# Patient Record
Sex: Male | Born: 1980 | Race: White | Hispanic: No | Smoking: Current every day smoker
Health system: Southern US, Community
[De-identification: ages and names within clinical notes are randomized; demographics above are authoritative.]

## PROBLEM LIST (undated history)

## (undated) DIAGNOSIS — F410 Panic disorder [episodic paroxysmal anxiety] without agoraphobia: Secondary | ICD-10-CM

## (undated) DIAGNOSIS — F329 Major depressive disorder, single episode, unspecified: Secondary | ICD-10-CM

## (undated) DIAGNOSIS — F32A Depression, unspecified: Secondary | ICD-10-CM

## (undated) DIAGNOSIS — Z8489 Family history of other specified conditions: Secondary | ICD-10-CM

## (undated) DIAGNOSIS — F319 Bipolar disorder, unspecified: Secondary | ICD-10-CM

## (undated) DIAGNOSIS — Z8739 Personal history of other diseases of the musculoskeletal system and connective tissue: Secondary | ICD-10-CM

## (undated) HISTORY — PX: LAPAROSCOPIC CHOLECYSTECTOMY: SUR755

---
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
## 2014-02-08 ENCOUNTER — Emergency Department (HOSPITAL_COMMUNITY)
Admission: EM | Admit: 2014-02-08 | Discharge: 2014-02-09 | Disposition: A | Discharge status: Home / Self Care | Payer: Self-pay | Attending: Emergency Medicine | Admitting: Emergency Medicine

## 2014-02-08 ENCOUNTER — Encounter (HOSPITAL_COMMUNITY): Payer: Self-pay | Admitting: *Deleted

## 2014-02-08 DIAGNOSIS — Z720 Tobacco use: Secondary | ICD-10-CM | POA: Insufficient documentation

## 2014-02-08 DIAGNOSIS — F419 Anxiety disorder, unspecified: Secondary | ICD-10-CM | POA: Insufficient documentation

## 2014-02-08 DIAGNOSIS — F1210 Cannabis abuse, uncomplicated: Secondary | ICD-10-CM | POA: Insufficient documentation

## 2014-02-08 DIAGNOSIS — Z79899 Other long term (current) drug therapy: Secondary | ICD-10-CM | POA: Insufficient documentation

## 2014-02-08 DIAGNOSIS — Z91148 Patient's other noncompliance with medication regimen for other reason: Secondary | ICD-10-CM

## 2014-02-08 DIAGNOSIS — F22 Delusional disorders: Secondary | ICD-10-CM

## 2014-02-08 DIAGNOSIS — Z9114 Patient's other noncompliance with medication regimen: Secondary | ICD-10-CM | POA: Insufficient documentation

## 2014-02-08 DIAGNOSIS — F1310 Sedative, hypnotic or anxiolytic abuse, uncomplicated: Secondary | ICD-10-CM | POA: Insufficient documentation

## 2014-02-08 DIAGNOSIS — F1910 Other psychoactive substance abuse, uncomplicated: Secondary | ICD-10-CM

## 2014-02-08 DIAGNOSIS — F1410 Cocaine abuse, uncomplicated: Secondary | ICD-10-CM | POA: Insufficient documentation

## 2014-02-08 HISTORY — DX: Panic disorder (episodic paroxysmal anxiety): F41.0

## 2014-02-08 HISTORY — DX: Bipolar disorder, unspecified: F31.9

## 2014-02-08 LAB — CBC WITH DIFFERENTIAL/PLATELET
Basophils Absolute: 0 10*3/uL (ref 0.0–0.1)
Basophils Relative: 0 % (ref 0–1)
Eosinophils Absolute: 0.2 10*3/uL (ref 0.0–0.7)
Eosinophils Relative: 2 % (ref 0–5)
HCT: 45.5 % (ref 39.0–52.0)
Hemoglobin: 15.8 g/dL (ref 13.0–17.0)
LYMPHS ABS: 1.3 10*3/uL (ref 0.7–4.0)
LYMPHS PCT: 18 % (ref 12–46)
MCH: 31.8 pg (ref 26.0–34.0)
MCHC: 34.7 g/dL (ref 30.0–36.0)
MCV: 91.5 fL (ref 78.0–100.0)
MONO ABS: 0.5 10*3/uL (ref 0.1–1.0)
Monocytes Relative: 7 % (ref 3–12)
Neutro Abs: 5.5 10*3/uL (ref 1.7–7.7)
Neutrophils Relative %: 73 % (ref 43–77)
PLATELETS: 274 10*3/uL (ref 150–400)
RBC: 4.97 MIL/uL (ref 4.22–5.81)
RDW: 13.1 % (ref 11.5–15.5)
WBC: 7.5 10*3/uL (ref 4.0–10.5)

## 2014-02-08 LAB — BASIC METABOLIC PANEL
Anion gap: 16 — ABNORMAL HIGH (ref 5–15)
BUN: 7 mg/dL (ref 6–23)
CALCIUM: 9.5 mg/dL (ref 8.4–10.5)
CO2: 26 meq/L (ref 19–32)
Chloride: 101 mEq/L (ref 96–112)
Creatinine, Ser: 0.78 mg/dL (ref 0.50–1.35)
GFR calc Af Amer: >90 mL/min (ref >90)
Glucose, Bld: 116 mg/dL — ABNORMAL HIGH (ref 70–99)
Potassium: 4 mEq/L (ref 3.7–5.3)
Sodium: 143 mEq/L (ref 137–147)

## 2014-02-08 LAB — ETHANOL: Alcohol, Ethyl (B): 26 mg/dL — ABNORMAL HIGH (ref 0–11)

## 2014-02-08 MED ORDER — LORAZEPAM 1 MG PO TABS
1.0000 mg | ORAL_TABLET | Freq: Three times a day (TID) | ORAL | Status: DC | PRN
  Administered 2014-02-09: 1 mg via ORAL
  Filled 2014-02-08: qty 1

## 2014-02-08 MED ORDER — IBUPROFEN 400 MG PO TABS
400.0000 mg | ORAL_TABLET | Freq: Three times a day (TID) | ORAL | Status: DC | PRN
  Administered 2014-02-09: 400 mg via ORAL
  Filled 2014-02-08: qty 1

## 2014-02-08 MED ORDER — ALUM & MAG HYDROXIDE-SIMETH 200-200-20 MG/5ML PO SUSP: 30.0000 mL | ORAL | Status: DC | PRN

## 2014-02-08 MED ORDER — NICOTINE 21 MG/24HR TD PT24: 21.0000 mg | MEDICATED_PATCH | Freq: Every day | TRANSDERMAL | Status: DC | PRN

## 2014-02-08 MED ORDER — ACETAMINOPHEN 325 MG PO TABS: 650.0000 mg | ORAL_TABLET | ORAL | Status: DC | PRN
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
## 2014-02-08 NOTE — BH Assessment (Signed)
Assessment complete. Jacquelyne BalintShalita Forrest, [facility], confirms adult unit is at capacity. Gave clinical report to Verne SpurrNeil Mashburn, PA-C who said Pt meets criteria for inpatient dual diagnosis treatment. TTS will contact other facilities. Notified Dr. Samuel JesterKathleen McManus and Neysa Bonitohristy, ED Charge RN at [facility], of recommendation.  Harlin RainFord Ellis Ria CommentWarrick Jr, LPC, [facility] Triage Specialist [phone]
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
## 2014-02-08 NOTE — BH Assessment (Signed)
Jacquelyne BalintShalita Forrest, [facility], confirms adult unit is currently at capacity. Contacted the following facilities for placement:  BED AVAILABLE, FAXED CLINICAL INFORMATION: [facility], per [facility], per [facility], per [facility], per [facility], per [facility], per Carney BernJean  AT CAPACITY: [facility], per [facility], per [facility], per [facility], per [facility], per [facility], per [facility], per [facility], per [facility], per [facility]hel[facility] DodgeSharita  NO RESPONSE: [facility]  PT DECLINED: [employer] (substance abuse) [facility] (substance abuse)   Harlin RainFord Ellis Ria CommentWarrick Jr, LPC, [facility] Triage Specialist [phone]

## 2014-02-08 NOTE — ED Notes (Addendum)
Pt was brought in by [employer]. [employer] was called out to the patients house for an argument between him and his girlfriend. Pt requested treatment for his bipolar and schizophrenia. Pt states he his bipolar and schizophenic and states he need his medication that he feels like he could "flip out" at any point. Pt denies any SI/HI. Pt is really concerned about having his xanax refilled. Pt states he is scared that his wife is poisoning his food.
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
## 2014-02-09 LAB — RAPID URINE DRUG SCREEN, HOSP PERFORMED
AMPHETAMINES: NOT DETECTED
Barbiturates: NOT DETECTED
Benzodiazepines: POSITIVE — AB
COCAINE: NOT DETECTED
Opiates: POSITIVE — AB
TETRAHYDROCANNABINOL: POSITIVE — AB

## 2014-02-09 MED ORDER — LORAZEPAM 1 MG PO TABS
2.0000 mg | ORAL_TABLET | Freq: Once | ORAL | Status: AC
  Administered 2014-02-09: 2 mg via ORAL
  Filled 2014-02-09: qty 2

## 2014-02-09 MED ORDER — LORAZEPAM 1 MG PO TABS
2.0000 mg | ORAL_TABLET | ORAL | Status: DC | PRN
  Administered 2014-02-09: 2 mg via ORAL
  Filled 2014-02-09: qty 2

## 2014-02-09 NOTE — ED Notes (Signed)
Pt came out of room assertive and demanding his clothes. Stated he was leaving and could "take my own medicine at home" explained that his treatment here was medical clearance and the detox treatment begins at the treatment facility once he has been transferred. Pt states he takes 4-5 xanax a day and drinks and he wants that here. So he is leaving if he can not have it. EDP notified, and went in room to speak to patient. Security at room for availability. Pt remains in room at this time.

## 2014-02-09 NOTE — ED Notes (Signed)
Pt woke up asking for "nerve pills" and something for pain. Was given prn ativan and motrin. Pt states he takes 4 or 5 2 mg xanax a day and the ativan "aint gonna do shit" but he took the medication anyway. He asked for water and a snack and it was given.
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
## 2014-02-09 NOTE — ED Provider Notes (Signed)
Patient became very upset when he was given a dose of lorazepam 1 mg. He states that he has been taking 8-10 mg alprazolam several times a day and that the 1 mg lorazepam was completely inadequate for him. He threatened to leave AGAINST MEDICAL ADVICE. I sent down to 12 with them and agreed to increase his lorazepam dose and frequency to try to keep him from going into withdrawal. Patient appeared satisfied with that and agreed to stay.  Dione Boozeavid Joniel Graumann, MD 02/09/14 [phone]

## 2014-02-09 NOTE — ED Notes (Signed)
Spoke from nurse [facility] who requested information about pt. For [facility].

## 2014-02-09 NOTE — ED Notes (Signed)
Alvia Phillip Mcdowell Marr Declined patient, [facility] notified.

## 2014-02-09 NOTE — ED Notes (Signed)
Pt came to nurses desk requesting to leave. Nurse advised pt she was going to tell doctor and she would be in there to get him to sign his papers to leave. Before nurse could inform doctor pt walked out of the ED.   EDP and charge nurse made aware.

## 2014-02-09 NOTE — BH Assessment (Signed)
Phillip GroveBrynn Mcdowell does not have IPRS funds with [facility] and therefore cannot accept Pt.  Phillip RainFord Mcdowell Phillip CommentWarrick Mcdowell, LPC, [facility] Triage Specialist [phone]

## 2014-02-09 NOTE — ED Notes (Signed)
Pt verbalized that he was going to leave in a few hours if he didn't have a placement. Nurse re-assured pt that we were seeking placement for him and that it might take some time.

## 2014-02-09 NOTE — ED Notes (Signed)
Pt agitated and c/o need for xanax not ativan, but stated he would take it. Said he didn't have to be here and would stay a while but was tired of it. And wanted his xanax

## 2014-05-11 ENCOUNTER — Encounter (HOSPITAL_COMMUNITY): Payer: Self-pay

## 2014-05-11 ENCOUNTER — Emergency Department (HOSPITAL_COMMUNITY)
Admission: EM | Admit: 2014-05-11 | Discharge: 2014-05-11 | Disposition: A | Discharge status: Home / Self Care | Payer: Self-pay | Attending: Emergency Medicine | Admitting: Emergency Medicine

## 2014-05-11 DIAGNOSIS — F1910 Other psychoactive substance abuse, uncomplicated: Secondary | ICD-10-CM

## 2014-05-11 DIAGNOSIS — F1410 Cocaine abuse, uncomplicated: Secondary | ICD-10-CM | POA: Insufficient documentation

## 2014-05-11 DIAGNOSIS — F410 Panic disorder [episodic paroxysmal anxiety] without agoraphobia: Secondary | ICD-10-CM | POA: Insufficient documentation

## 2014-05-11 DIAGNOSIS — F1310 Sedative, hypnotic or anxiolytic abuse, uncomplicated: Secondary | ICD-10-CM | POA: Insufficient documentation

## 2014-05-11 DIAGNOSIS — F1210 Cannabis abuse, uncomplicated: Secondary | ICD-10-CM | POA: Insufficient documentation

## 2014-05-11 DIAGNOSIS — Z720 Tobacco use: Secondary | ICD-10-CM | POA: Insufficient documentation

## 2014-05-11 DIAGNOSIS — Z880 Allergy status to penicillin: Secondary | ICD-10-CM | POA: Insufficient documentation

## 2014-05-11 DIAGNOSIS — Z79899 Other long term (current) drug therapy: Secondary | ICD-10-CM | POA: Insufficient documentation

## 2014-05-11 LAB — CBC WITH DIFFERENTIAL/PLATELET
Basophils Absolute: 0 10*3/uL (ref 0.0–0.1)
Basophils Relative: 0 % (ref 0–1)
EOS ABS: 0.1 10*3/uL (ref 0.0–0.7)
Eosinophils Relative: 1 % (ref 0–5)
HEMATOCRIT: 50.6 % (ref 39.0–52.0)
HEMOGLOBIN: 18.2 g/dL — AB (ref 13.0–17.0)
LYMPHS ABS: 1.5 10*3/uL (ref 0.7–4.0)
Lymphocytes Relative: 16 % (ref 12–46)
MCH: 33.7 pg (ref 26.0–34.0)
MCHC: 36 g/dL (ref 30.0–36.0)
MCV: 93.7 fL (ref 78.0–100.0)
MONOS PCT: 8 % (ref 3–12)
Monocytes Absolute: 0.7 10*3/uL (ref 0.1–1.0)
NEUTROS ABS: 7.3 10*3/uL (ref 1.7–7.7)
Neutrophils Relative %: 75 % (ref 43–77)
Platelets: 232 10*3/uL (ref 150–400)
RBC: 5.4 MIL/uL (ref 4.22–5.81)
RDW: 13.4 % (ref 11.5–15.5)
WBC: 9.6 10*3/uL (ref 4.0–10.5)

## 2014-05-11 LAB — COMPREHENSIVE METABOLIC PANEL
ALK PHOS: 96 U/L (ref 39–117)
ALT: 77 U/L — AB (ref 0–53)
AST: 67 U/L — ABNORMAL HIGH (ref 0–37)
Albumin: 4.5 g/dL (ref 3.5–5.2)
Anion gap: 10 (ref 5–15)
BUN: 6 mg/dL (ref 6–23)
CO2: 28 mmol/L (ref 19–32)
Calcium: 9.4 mg/dL (ref 8.4–10.5)
Chloride: 103 mmol/L (ref 96–112)
Creatinine, Ser: 0.91 mg/dL (ref 0.50–1.35)
GFR calc non Af Amer: >90 mL/min (ref >90)
GLUCOSE: 132 mg/dL — AB (ref 70–99)
POTASSIUM: 4 mmol/L (ref 3.5–5.1)
Sodium: 141 mmol/L (ref 135–145)
TOTAL PROTEIN: 8.1 g/dL (ref 6.0–8.3)
Total Bilirubin: 0.8 mg/dL (ref 0.3–1.2)

## 2014-05-11 LAB — RAPID URINE DRUG SCREEN, HOSP PERFORMED
AMPHETAMINES: NOT DETECTED
Barbiturates: NOT DETECTED
Benzodiazepines: POSITIVE — AB
Cocaine: POSITIVE — AB
Opiates: NOT DETECTED
Tetrahydrocannabinol: POSITIVE — AB

## 2014-05-11 LAB — ETHANOL: ALCOHOL ETHYL (B): 118 mg/dL — AB (ref 0–9)

## 2014-05-11 MED ORDER — NICOTINE 21 MG/24HR TD PT24
21.0000 mg | MEDICATED_PATCH | Freq: Once | TRANSDERMAL | Status: DC
  Administered 2014-05-11: 21 mg via TRANSDERMAL
  Filled 2014-05-11: qty 1

## 2014-05-11 MED ORDER — LORAZEPAM 1 MG PO TABS
1.0000 mg | ORAL_TABLET | Freq: Once | ORAL | Status: AC
  Administered 2014-05-11: 1 mg via ORAL
  Filled 2014-05-11: qty 1

## 2014-05-11 MED ORDER — LORAZEPAM 1 MG PO TABS
0.0000 mg | ORAL_TABLET | Freq: Four times a day (QID) | ORAL | Status: DC
  Administered 2014-05-11 (×2): 1 mg via ORAL
  Filled 2014-05-11 (×2): qty 1

## 2014-05-11 MED ORDER — LORAZEPAM 1 MG PO TABS
0.0000 mg | ORAL_TABLET | Freq: Two times a day (BID) | ORAL | Status: DC
Start: 2014-05-13 — End: 2014-05-11

## 2014-05-11 NOTE — ED Notes (Addendum)
Pt states needs help getting off all drugs. "Says drinks daily, smokes crack, shooting heroin, eating pain pills like tic tac's" . States used cocaine before coming to the ER.
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
## 2014-05-11 NOTE — BH Assessment (Signed)
Call[facility] and [facility] with [facility] at [facility]. Gave [facility] the [facility] authorization number. [facility] said they would send a car to get the Pt at 8pm on 05/11/14. Gave Tyawn the number for the nurse's station at [facility].   Rollen SoxMary Sweden Lesure, MA, LPCA, LCASA Therapeutic Triage Specialist [facility]

## 2014-05-11 NOTE — ED Notes (Signed)
Phillip KaKathy Allen, RN [facility] called to speak with pt.

## 2014-05-11 NOTE — ED Notes (Addendum)
Sp[facility] with [facility] [phone]).  [facility] has 10 pts ahead of him.  Likely pt will not be evaluated this weekend [facility].  Pt may be discharged and for possible direct admit at [facility].  Dr Clarene DukeMcManus informed.  In[facility] by [facility], that pt had previously refused to go to [facility].

## 2014-05-11 NOTE — ED Notes (Signed)
Phill[facility]cdowell at [facility] [place] called to speak with the Pt, after which she told us that we needed to get  [facility] authorization for the patient to go there.  I spoke with Irving BurtonEmily at [facility] and she will take care of it.

## 2014-05-11 NOTE — ED Notes (Signed)
Pt signed voluntary admission papers.  [facility]

## 2014-05-11 NOTE — ED Notes (Signed)
Dr Hyacinth MeekerMiller informed of pt admission at 2100 [facility] per Maryjean KaKathy Allen, RN.

## 2014-05-11 NOTE — ED Notes (Signed)
Pt has the smell of ETOH on his breath.

## 2014-05-11 NOTE — ED Provider Notes (Signed)
CSN: [id number]     Arrival date & time 05/11/14  0358 History   First MD Initiated Contact with Patient 05/11/14 [phone]     Chief Complaint  Patient presents with  . Medical Clearance     (Consider location/radiation/quality/duration/timing/severity/associated sxs/prior Treatment) HPI Comments: Patient is a 34 year old male with history of polysubstance abuse. He presents here requesting detox from drugs and alcohol and treatment for substance abuse. He tells me that he drinks every day, does $20 worth of hair one per day, does $50 worth of crack per day, and eats pain pills like "take tacks". His last drug use was cocaine which he used just before coming here.  The history is provided by the patient.    Past Medical History  Diagnosis Date  . Bipolar 1 disorder   . Schizophrenia   . Panic attack    Past Surgical History  Procedure Laterality Date  . Cholecystectomy     No family history on file. History  Substance Use Topics  . Smoking status: Current Every Day Smoker -- 0.50 packs/day    Types: Cigarettes  . Smokeless tobacco: Not on file  . Alcohol Use: Yes    Review of Systems  All other systems reviewed and are negative.     Allergies  Penicillins and Sulfa antibiotics  Home Medications   Prior to Admission medications   Medication Sig Start Date End Date Taking? Authorizing Provider  ALPRAZolam Prudy Feeler(XANAX) 0.5 MG tablet Take 0.5-1 mg by mouth 4 (four) times daily as needed for anxiety.   Yes Historical Provider, MD   BP 138/84 mmHg  Pulse 92  Temp(Src) 97.9 F (36.6 C) (Oral)  Resp 16  Ht 6' (1.829 m)  Wt 185 lb (83.915 kg)  BMI 25.08 kg/m2  SpO2 100% Physical Exam  Constitutional: He is oriented to person, place, and time. He appears well-developed and well-nourished. No distress.  HENT:  Head: Normocephalic and atraumatic.  Neck: Normal range of motion. Neck supple.  Cardiovascular: Normal rate, regular rhythm and normal heart sounds.   No murmur  heard. Pulmonary/Chest: Effort normal and breath sounds normal. No respiratory distress. He has no wheezes.  Abdominal: Soft. Bowel sounds are normal. He exhibits no distension. There is no tenderness.  Musculoskeletal: Normal range of motion. He exhibits no edema.  Neurological: He is alert and oriented to person, place, and time.  Skin: Skin is warm and dry. He is not diaphoretic.  Nursing note and vitals reviewed.   ED Course  Procedures (including critical care time) Labs Review Labs Reviewed  URINE RAPID DRUG SCREEN (HOSP PERFORMED)  ETHANOL  CBC WITH DIFFERENTIAL/PLATELET  COMPREHENSIVE METABOLIC PANEL    Imaging Review No results found.   EKG Interpretation None      MDM   Final diagnoses:  None    Patient here requesting help with from multiple drugs. [facility] has been consulted and will make arrangements for this.    Geoffery Lyonsouglas Leif Loflin, MD 05/11/14 [phone]

## 2014-05-11 NOTE — Progress Notes (Signed)
CSW spoke with RN and MD regarding patient referral to [facility].  CSW gave updated bed availability to RN and MD.  CSW also spoke with patient to see if patient would be willing to reconsider [facility] due to [facility] not [facility] and [facility] reporting there are 10 referrals ahead of patient.  CSW will make referral to [facility].  Pending: [facility]  Phillip AmasEdith Maram Bently, LCSW Disposition Social Worker [phone]

## 2014-05-11 NOTE — ED Notes (Signed)
Pt states he wants to get help to get off of heroin, crack, narcotic pain pills, alcohol.   Pt denies SI/HI, states he has been in treatment in the past.

## 2014-05-11 NOTE — Progress Notes (Signed)
CSW followed up with [facility].  CSW spoke with Zenda Alpersian who reported that they have not reviewed the referral as of this time but that she would and would call back with disposition but that there are several people ahead of patient.  CSW will follow up.  Adelene AmasEdith Lawarence Meek, LCSW Disposition Social Worker [phone]

## 2014-09-07 ENCOUNTER — Encounter (HOSPITAL_COMMUNITY): Payer: Self-pay | Admitting: Emergency Medicine

## 2014-09-07 ENCOUNTER — Emergency Department (HOSPITAL_COMMUNITY)
Admission: EM | Admit: 2014-09-07 | Discharge: 2014-09-07 | Disposition: A | Discharge status: Home / Self Care | Payer: No Typology Code available for payment source | Attending: Emergency Medicine | Admitting: Emergency Medicine

## 2014-09-07 DIAGNOSIS — Z880 Allergy status to penicillin: Secondary | ICD-10-CM | POA: Diagnosis not present

## 2014-09-07 DIAGNOSIS — Y9389 Activity, other specified: Secondary | ICD-10-CM | POA: Diagnosis not present

## 2014-09-07 DIAGNOSIS — F410 Panic disorder [episodic paroxysmal anxiety] without agoraphobia: Secondary | ICD-10-CM | POA: Diagnosis not present

## 2014-09-07 DIAGNOSIS — S93401A Sprain of unspecified ligament of right ankle, initial encounter: Secondary | ICD-10-CM | POA: Diagnosis not present

## 2014-09-07 DIAGNOSIS — Z720 Tobacco use: Secondary | ICD-10-CM | POA: Diagnosis not present

## 2014-09-07 DIAGNOSIS — S93409A Sprain of unspecified ligament of unspecified ankle, initial encounter: Secondary | ICD-10-CM

## 2014-09-07 DIAGNOSIS — Y998 Other external cause status: Secondary | ICD-10-CM | POA: Diagnosis not present

## 2014-09-07 DIAGNOSIS — S99911A Unspecified injury of right ankle, initial encounter: Secondary | ICD-10-CM | POA: Diagnosis present

## 2014-09-07 DIAGNOSIS — Y92410 Unspecified street and highway as the place of occurrence of the external cause: Secondary | ICD-10-CM | POA: Insufficient documentation

## 2014-09-07 DIAGNOSIS — S93402A Sprain of unspecified ligament of left ankle, initial encounter: Secondary | ICD-10-CM | POA: Insufficient documentation

## 2014-09-07 MED ORDER — DEXAMETHASONE 4 MG PO TABS: 4.0000 mg | ORAL_TABLET | Freq: Two times a day (BID) | ORAL | Status: DC

## 2014-09-07 MED ORDER — KETOROLAC TROMETHAMINE 10 MG PO TABS
10.0000 mg | ORAL_TABLET | Freq: Once | ORAL | Status: AC
  Administered 2014-09-07: 10 mg via ORAL
  Filled 2014-09-07: qty 1

## 2014-09-07 MED ORDER — PREDNISONE 50 MG PO TABS
60.0000 mg | ORAL_TABLET | Freq: Once | ORAL | Status: AC
  Administered 2014-09-07: 60 mg via ORAL
  Filled 2014-09-07 (×2): qty 1

## 2014-09-07 MED ORDER — DICLOFENAC SODIUM 75 MG PO TBEC: 75.0000 mg | DELAYED_RELEASE_TABLET | Freq: Two times a day (BID) | ORAL | Status: DC
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
## 2014-09-07 NOTE — Discharge Instructions (Signed)
Please use crutches until you can safely apply weight to your lower extremities. Please use the ankle stirrup splints for the next 10 days. Use Decadron and diclofenac 2 times daily with food daily until all taken. Please see Dr. Romeo Apple, or the orthopedic specialist of your choice if not improving. Ankle Sprain An ankle sprain is an injury to the strong, fibrous tissues (ligaments) that hold the bones of your ankle joint together.  CAUSES An ankle sprain is usually caused by a fall or by twisting your ankle. Ankle sprains most commonly occur when you step on the outer edge of your foot, and your ankle turns inward. People who participate in sports are more prone to these types of injuries.  SYMPTOMS   Pain in your ankle. The pain may be present at rest or only when you are trying to stand or walk.  Swelling.  Bruising. Bruising may develop immediately or within 1 to 2 days after your injury.  Difficulty standing or walking, particularly when turning corners or changing directions. DIAGNOSIS  Your caregiver will ask you details about your injury and perform a physical exam of your ankle to determine if you have an ankle sprain. During the physical exam, your caregiver will press on and apply pressure to specific areas of your foot and ankle. Your caregiver will try to move your ankle in certain ways. An X-ray exam may be done to be sure a bone was not broken or a ligament did not separate from one of the bones in your ankle (avulsion fracture).  TREATMENT  Certain types of braces can help stabilize your ankle. Your caregiver can make a recommendation for this. Your caregiver may recommend the use of medicine for pain. If your sprain is severe, your caregiver may refer you to a surgeon who helps to restore function to parts of your skeletal system (orthopedist) or a physical therapist. HOME CARE INSTRUCTIONS   Apply ice to your injury for 1-2 days or as directed by your caregiver. Applying ice  helps to reduce inflammation and pain.  Put ice in a plastic bag.  Place a towel between your skin and the bag.  Leave the ice on for 15-20 minutes at a time, every 2 hours while you are awake.  Only take over-the-counter or prescription medicines for pain, discomfort, or fever as directed by your caregiver.  Elevate your injured ankle above the level of your heart as much as possible for 2-3 days.  If your caregiver recommends crutches, use them as instructed. Gradually put weight on the affected ankle. Continue to use crutches or a cane until you can walk without feeling pain in your ankle.  If you have a plaster splint, wear the splint as directed by your caregiver. Do not rest it on anything harder than a pillow for the first 24 hours. Do not put weight on it. Do not get it wet. You may take it off to take a shower or bath.  You may have been given an elastic bandage to wear around your ankle to provide support. If the elastic bandage is too tight (you have numbness or tingling in your foot or your foot becomes cold and blue), adjust the bandage to make it comfortable.  If you have an air splint, you may blow more air into it or let air out to make it more comfortable. You may take your splint off at night and before taking a shower or bath. Wiggle your toes in the splint several times  per day to decrease swelling. SEEK MEDICAL CARE IF:   You have rapidly increasing bruising or swelling.  Your toes feel extremely cold or you lose feeling in your foot.  Your pain is not relieved with medicine. SEEK IMMEDIATE MEDICAL CARE IF:  Your toes are numb or blue.  You have severe pain that is increasing. MAKE SURE YOU:   Understand these instructions.  Will watch your condition.  Will get help right away if you are not doing well or get worse. Document Released: 02/08/2005 Document Revised: 11/03/2011 Document Reviewed: 02/20/2011 [facility] Patient Information 2015 [employer] [place].  This information is not intended to replace advice given to you by your health care provider. Make sure you discuss any questions you have with your health care provider.
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
## 2015-05-01 ENCOUNTER — Emergency Department (HOSPITAL_COMMUNITY)
Admission: EM | Admit: 2015-05-01 | Discharge: 2015-05-02 | Discharge status: Against Medical Advice | Payer: No Typology Code available for payment source | Attending: Emergency Medicine | Admitting: Emergency Medicine

## 2015-05-01 ENCOUNTER — Encounter (HOSPITAL_COMMUNITY): Payer: Self-pay

## 2015-05-01 DIAGNOSIS — F17210 Nicotine dependence, cigarettes, uncomplicated: Secondary | ICD-10-CM | POA: Insufficient documentation

## 2015-05-01 DIAGNOSIS — Y999 Unspecified external cause status: Secondary | ICD-10-CM | POA: Insufficient documentation

## 2015-05-01 DIAGNOSIS — F319 Bipolar disorder, unspecified: Secondary | ICD-10-CM | POA: Insufficient documentation

## 2015-05-01 DIAGNOSIS — S61412A Laceration without foreign body of left hand, initial encounter: Secondary | ICD-10-CM

## 2015-05-01 DIAGNOSIS — Z79899 Other long term (current) drug therapy: Secondary | ICD-10-CM | POA: Insufficient documentation

## 2015-05-01 DIAGNOSIS — R109 Unspecified abdominal pain: Secondary | ICD-10-CM | POA: Insufficient documentation

## 2015-05-01 DIAGNOSIS — Y929 Unspecified place or not applicable: Secondary | ICD-10-CM | POA: Insufficient documentation

## 2015-05-01 DIAGNOSIS — F209 Schizophrenia, unspecified: Secondary | ICD-10-CM | POA: Insufficient documentation

## 2015-05-01 DIAGNOSIS — Y939 Activity, unspecified: Secondary | ICD-10-CM | POA: Insufficient documentation

## 2015-05-01 NOTE — ED Notes (Signed)
Patient presents in police custody. Pt complains of right lower abdominal pain. Patient remains belligerent and refuses to let me or staff examine his left hand laceration. Patient states he wants his abdomine checked. Patient admits to drinking alcohol today. Patient also admits to heroin overdose yesterday.  Emergency planning/management officerolice officer with patient states that the jail refuses custody of patient until his hand is treated.
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
## 2015-05-02 LAB — ETHANOL: ALCOHOL ETHYL (B): 98 mg/dL — AB (ref <5)

## 2015-05-02 NOTE — ED Notes (Signed)
Patient told officer that he wanted to leave AMA and go to jail.
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
## 2016-09-26 ENCOUNTER — Inpatient Hospital Stay (HOSPITAL_COMMUNITY): Payer: Medicaid Other

## 2016-09-26 ENCOUNTER — Emergency Department (HOSPITAL_COMMUNITY): Payer: Medicaid Other

## 2016-09-26 ENCOUNTER — Inpatient Hospital Stay (HOSPITAL_COMMUNITY)
Admission: EM | Admit: 2016-09-26 | Discharge: 2016-10-06 | DRG: 064 | Disposition: A | Discharge status: Home Health | Payer: Medicaid Other | Attending: Family Medicine | Admitting: Family Medicine

## 2016-09-26 ENCOUNTER — Encounter (HOSPITAL_COMMUNITY): Payer: Self-pay | Admitting: *Deleted

## 2016-09-26 DIAGNOSIS — F1910 Other psychoactive substance abuse, uncomplicated: Secondary | ICD-10-CM | POA: Diagnosis not present

## 2016-09-26 DIAGNOSIS — F15129 Other stimulant abuse with intoxication, unspecified: Secondary | ICD-10-CM | POA: Diagnosis present

## 2016-09-26 DIAGNOSIS — R748 Abnormal levels of other serum enzymes: Secondary | ICD-10-CM

## 2016-09-26 DIAGNOSIS — R930 Abnormal findings on diagnostic imaging of skull and head, not elsewhere classified: Secondary | ICD-10-CM

## 2016-09-26 DIAGNOSIS — I639 Cerebral infarction, unspecified: Secondary | ICD-10-CM | POA: Diagnosis present

## 2016-09-26 DIAGNOSIS — F1290 Cannabis use, unspecified, uncomplicated: Secondary | ICD-10-CM | POA: Diagnosis present

## 2016-09-26 DIAGNOSIS — K859 Acute pancreatitis without necrosis or infection, unspecified: Secondary | ICD-10-CM

## 2016-09-26 DIAGNOSIS — B1920 Unspecified viral hepatitis C without hepatic coma: Secondary | ICD-10-CM | POA: Diagnosis present

## 2016-09-26 DIAGNOSIS — M79604 Pain in right leg: Secondary | ICD-10-CM | POA: Diagnosis present

## 2016-09-26 DIAGNOSIS — F259 Schizoaffective disorder, unspecified: Secondary | ICD-10-CM | POA: Diagnosis present

## 2016-09-26 DIAGNOSIS — G9340 Encephalopathy, unspecified: Secondary | ICD-10-CM

## 2016-09-26 DIAGNOSIS — F1220 Cannabis dependence, uncomplicated: Secondary | ICD-10-CM | POA: Diagnosis not present

## 2016-09-26 DIAGNOSIS — F1110 Opioid abuse, uncomplicated: Secondary | ICD-10-CM | POA: Diagnosis present

## 2016-09-26 DIAGNOSIS — Z880 Allergy status to penicillin: Secondary | ICD-10-CM | POA: Diagnosis not present

## 2016-09-26 DIAGNOSIS — R451 Restlessness and agitation: Secondary | ICD-10-CM | POA: Diagnosis present

## 2016-09-26 DIAGNOSIS — M79605 Pain in left leg: Secondary | ICD-10-CM

## 2016-09-26 DIAGNOSIS — R200 Anesthesia of skin: Secondary | ICD-10-CM

## 2016-09-26 DIAGNOSIS — M545 Low back pain: Secondary | ICD-10-CM | POA: Diagnosis present

## 2016-09-26 DIAGNOSIS — R778 Other specified abnormalities of plasma proteins: Secondary | ICD-10-CM

## 2016-09-26 DIAGNOSIS — M549 Dorsalgia, unspecified: Secondary | ICD-10-CM

## 2016-09-26 DIAGNOSIS — E876 Hypokalemia: Secondary | ICD-10-CM

## 2016-09-26 DIAGNOSIS — F17210 Nicotine dependence, cigarettes, uncomplicated: Secondary | ICD-10-CM | POA: Diagnosis present

## 2016-09-26 DIAGNOSIS — F319 Bipolar disorder, unspecified: Secondary | ICD-10-CM

## 2016-09-26 DIAGNOSIS — M21371 Foot drop, right foot: Secondary | ICD-10-CM | POA: Diagnosis present

## 2016-09-26 DIAGNOSIS — E871 Hypo-osmolality and hyponatremia: Secondary | ICD-10-CM

## 2016-09-26 DIAGNOSIS — M25452 Effusion, left hip: Secondary | ICD-10-CM | POA: Diagnosis present

## 2016-09-26 DIAGNOSIS — Z9114 Patient's other noncompliance with medication regimen: Secondary | ICD-10-CM | POA: Diagnosis not present

## 2016-09-26 DIAGNOSIS — R9089 Other abnormal findings on diagnostic imaging of central nervous system: Secondary | ICD-10-CM

## 2016-09-26 DIAGNOSIS — M109 Gout, unspecified: Secondary | ICD-10-CM | POA: Diagnosis present

## 2016-09-26 DIAGNOSIS — R296 Repeated falls: Secondary | ICD-10-CM | POA: Diagnosis present

## 2016-09-26 DIAGNOSIS — M4802 Spinal stenosis, cervical region: Secondary | ICD-10-CM | POA: Diagnosis present

## 2016-09-26 DIAGNOSIS — R9431 Abnormal electrocardiogram [ECG] [EKG]: Secondary | ICD-10-CM

## 2016-09-26 DIAGNOSIS — E860 Dehydration: Secondary | ICD-10-CM | POA: Diagnosis present

## 2016-09-26 DIAGNOSIS — G9341 Metabolic encephalopathy: Secondary | ICD-10-CM

## 2016-09-26 DIAGNOSIS — R7401 Elevation of levels of liver transaminase levels: Secondary | ICD-10-CM

## 2016-09-26 DIAGNOSIS — T404X2A Poisoning by other synthetic narcotics, intentional self-harm, initial encounter: Secondary | ICD-10-CM | POA: Diagnosis not present

## 2016-09-26 DIAGNOSIS — M25559 Pain in unspecified hip: Secondary | ICD-10-CM

## 2016-09-26 DIAGNOSIS — I6381 Other cerebral infarction due to occlusion or stenosis of small artery: Secondary | ICD-10-CM

## 2016-09-26 DIAGNOSIS — R52 Pain, unspecified: Secondary | ICD-10-CM

## 2016-09-26 DIAGNOSIS — R292 Abnormal reflex: Secondary | ICD-10-CM | POA: Diagnosis present

## 2016-09-26 DIAGNOSIS — R740 Nonspecific elevation of levels of transaminase and lactic acid dehydrogenase [LDH]: Secondary | ICD-10-CM | POA: Diagnosis present

## 2016-09-26 DIAGNOSIS — G9349 Other encephalopathy: Secondary | ICD-10-CM | POA: Diagnosis present

## 2016-09-26 DIAGNOSIS — M609 Myositis, unspecified: Secondary | ICD-10-CM

## 2016-09-26 DIAGNOSIS — R7989 Other specified abnormal findings of blood chemistry: Secondary | ICD-10-CM

## 2016-09-26 DIAGNOSIS — M6282 Rhabdomyolysis: Secondary | ICD-10-CM

## 2016-09-26 DIAGNOSIS — R531 Weakness: Secondary | ICD-10-CM

## 2016-09-26 DIAGNOSIS — R4182 Altered mental status, unspecified: Secondary | ICD-10-CM | POA: Diagnosis present

## 2016-09-26 DIAGNOSIS — M25552 Pain in left hip: Secondary | ICD-10-CM

## 2016-09-26 DIAGNOSIS — F410 Panic disorder [episodic paroxysmal anxiety] without agoraphobia: Secondary | ICD-10-CM | POA: Diagnosis present

## 2016-09-26 DIAGNOSIS — D72829 Elevated white blood cell count, unspecified: Secondary | ICD-10-CM

## 2016-09-26 DIAGNOSIS — T404X1A Poisoning by other synthetic narcotics, accidental (unintentional), initial encounter: Secondary | ICD-10-CM | POA: Diagnosis present

## 2016-09-26 DIAGNOSIS — Z79899 Other long term (current) drug therapy: Secondary | ICD-10-CM

## 2016-09-26 DIAGNOSIS — F209 Schizophrenia, unspecified: Secondary | ICD-10-CM

## 2016-09-26 HISTORY — DX: Personal history of other diseases of the musculoskeletal system and connective tissue: Z87.39

## 2016-09-26 HISTORY — DX: Major depressive disorder, single episode, unspecified: F32.9

## 2016-09-26 HISTORY — DX: Family history of other specified conditions: Z84.89

## 2016-09-26 HISTORY — DX: Depression, unspecified: F32.A

## 2016-09-26 LAB — BLOOD GAS, ARTERIAL
ACID-BASE DEFICIT: 1.2 mmol/L (ref 0.0–2.0)
Bicarbonate: 23.6 mmol/L (ref 20.0–28.0)
Drawn by: 23534
FIO2: 0.21
O2 Content: 21 L/min
O2 SAT: 96.8 %
pCO2 arterial: 37.8 mmHg (ref 32.0–48.0)
pH, Arterial: 7.4 (ref 7.350–7.450)
pO2, Arterial: 89.4 mmHg (ref 83.0–108.0)

## 2016-09-26 LAB — COMPREHENSIVE METABOLIC PANEL
ALT: 231 U/L — ABNORMAL HIGH (ref 17–63)
AST: 719 U/L — ABNORMAL HIGH (ref 15–41)
Albumin: 3.4 g/dL — ABNORMAL LOW (ref 3.5–5.0)
Alkaline Phosphatase: 93 U/L (ref 38–126)
Anion gap: 8 (ref 5–15)
BUN: 12 mg/dL (ref 6–20)
CO2: 21 mmol/L — ABNORMAL LOW (ref 22–32)
CREATININE: 0.97 mg/dL (ref 0.61–1.24)
Calcium: 8.6 mg/dL — ABNORMAL LOW (ref 8.9–10.3)
Chloride: 99 mmol/L — ABNORMAL LOW (ref 101–111)
Glucose, Bld: 149 mg/dL — ABNORMAL HIGH (ref 65–99)
POTASSIUM: 4 mmol/L (ref 3.5–5.1)
Sodium: 128 mmol/L — ABNORMAL LOW (ref 135–145)
Total Bilirubin: 1.6 mg/dL — ABNORMAL HIGH (ref 0.3–1.2)
Total Protein: 6.8 g/dL (ref 6.5–8.1)

## 2016-09-26 LAB — CBC WITH DIFFERENTIAL/PLATELET
Basophils Absolute: 0 10*3/uL (ref 0.0–0.1)
Basophils Relative: 0 %
EOS PCT: 1 %
Eosinophils Absolute: 0.2 10*3/uL (ref 0.0–0.7)
HCT: 50.5 % (ref 39.0–52.0)
Hemoglobin: 18.1 g/dL — ABNORMAL HIGH (ref 13.0–17.0)
LYMPHS ABS: 1.3 10*3/uL (ref 0.7–4.0)
Lymphocytes Relative: 7 %
MCH: 34 pg (ref 26.0–34.0)
MCHC: 35.8 g/dL (ref 30.0–36.0)
MCV: 94.7 fL (ref 78.0–100.0)
MONOS PCT: 8 %
Monocytes Absolute: 1.5 10*3/uL — ABNORMAL HIGH (ref 0.1–1.0)
Neutro Abs: 15.5 10*3/uL — ABNORMAL HIGH (ref 1.7–7.7)
Neutrophils Relative %: 84 %
PLATELETS: 185 10*3/uL (ref 150–400)
RBC: 5.33 MIL/uL (ref 4.22–5.81)
RDW: 13.6 % (ref 11.5–15.5)
WBC: 18.5 10*3/uL — ABNORMAL HIGH (ref 4.0–10.5)

## 2016-09-26 LAB — SYNOVIAL CELL COUNT + DIFF, W/ CRYSTALS
Crystals, Fluid: NONE SEEN
Eosinophils-Synovial: 0 % (ref 0–1)
Lymphocytes-Synovial Fld: 2 % (ref 0–20)
MONOCYTE-MACROPHAGE-SYNOVIAL FLUID: 50 % (ref 50–90)
Neutrophil, Synovial: 48 % — ABNORMAL HIGH (ref 0–25)
WBC, Synovial: 2520 /mm3 — ABNORMAL HIGH (ref 0–200)

## 2016-09-26 LAB — LIPASE, BLOOD: Lipase: 210 U/L — ABNORMAL HIGH (ref 11–51)

## 2016-09-26 LAB — URINALYSIS, ROUTINE W REFLEX MICROSCOPIC
Bilirubin Urine: NEGATIVE
Glucose, UA: NEGATIVE mg/dL
KETONES UR: NEGATIVE mg/dL
Leukocytes, UA: NEGATIVE
Nitrite: NEGATIVE
Protein, ur: 100 mg/dL — AB
Specific Gravity, Urine: 1.011 (ref 1.005–1.030)
pH: 5 (ref 5.0–8.0)

## 2016-09-26 LAB — TROPONIN I
TROPONIN I: 0.58 ng/mL — AB (ref <0.03)
Troponin I: 0.57 ng/mL (ref <0.03)

## 2016-09-26 LAB — I-STAT CG4 LACTIC ACID, ED: Lactic Acid, Venous: 1.7 mmol/L (ref 0.5–1.9)

## 2016-09-26 LAB — CK: CK TOTAL: 36747 U/L — AB (ref 49–397)

## 2016-09-26 LAB — RAPID URINE DRUG SCREEN, HOSP PERFORMED
Amphetamines: POSITIVE — AB
Barbiturates: NOT DETECTED
Benzodiazepines: POSITIVE — AB
COCAINE: NOT DETECTED
OPIATES: POSITIVE — AB
Tetrahydrocannabinol: NOT DETECTED

## 2016-09-26 LAB — ACETAMINOPHEN LEVEL

## 2016-09-26 LAB — SALICYLATE LEVEL

## 2016-09-26 LAB — ETHANOL: Alcohol, Ethyl (B): <5 mg/dL (ref <5)

## 2016-09-26 MED ORDER — VANCOMYCIN HCL IN DEXTROSE 1-5 GM/200ML-% IV SOLN
1000.0000 mg | Freq: Once | INTRAVENOUS | Status: AC
  Administered 2016-09-26: 1000 mg via INTRAVENOUS
  Filled 2016-09-26: qty 200

## 2016-09-26 MED ORDER — LORAZEPAM 2 MG/ML IJ SOLN
1.0000 mg | Freq: Four times a day (QID) | INTRAMUSCULAR | Status: AC | PRN
  Administered 2016-09-26 – 2016-09-29 (×7): 1 mg via INTRAVENOUS
  Filled 2016-09-26 (×8): qty 1

## 2016-09-26 MED ORDER — VANCOMYCIN HCL IN DEXTROSE 1-5 GM/200ML-% IV SOLN
1000.0000 mg | Freq: Three times a day (TID) | INTRAVENOUS | Status: DC
  Administered 2016-09-26 – 2016-09-28 (×6): 1000 mg via INTRAVENOUS
  Filled 2016-09-26 (×7): qty 200

## 2016-09-26 MED ORDER — DEXAMETHASONE SODIUM PHOSPHATE 10 MG/ML IJ SOLN
10.0000 mg | Freq: Once | INTRAMUSCULAR | Status: AC
  Administered 2016-09-26: 10 mg via INTRAVENOUS
  Filled 2016-09-26: qty 1

## 2016-09-26 MED ORDER — DEXTROSE 5 % IV SOLN
2.0000 g | Freq: Once | INTRAVENOUS | Status: AC
  Administered 2016-09-26: 2 g via INTRAVENOUS
  Filled 2016-09-26: qty 2

## 2016-09-26 MED ORDER — SODIUM CHLORIDE 0.9 % IV SOLN
INTRAVENOUS | Status: DC
  Administered 2016-09-26: 1000 mL via INTRAVENOUS
  Administered 2016-09-27: 150 mL via INTRAVENOUS
  Administered 2016-09-28 – 2016-10-01 (×2): via INTRAVENOUS

## 2016-09-26 MED ORDER — SODIUM CHLORIDE 0.9 % IV BOLUS (SEPSIS)
1000.0000 mL | Freq: Once | INTRAVENOUS | Status: AC
  Administered 2016-09-26: 1000 mL via INTRAVENOUS

## 2016-09-26 MED ORDER — ACETAMINOPHEN 325 MG PO TABS
650.0000 mg | ORAL_TABLET | Freq: Four times a day (QID) | ORAL | Status: DC | PRN
  Administered 2016-09-27 – 2016-10-03 (×15): 650 mg via ORAL
  Filled 2016-09-26 (×16): qty 2

## 2016-09-26 MED ORDER — LORAZEPAM 2 MG/ML IJ SOLN
1.0000 mg | Freq: Once | INTRAMUSCULAR | Status: AC
  Administered 2016-09-26: 1 mg via INTRAVENOUS
  Filled 2016-09-26: qty 1

## 2016-09-26 MED ORDER — ONDANSETRON HCL 4 MG PO TABS
4.0000 mg | ORAL_TABLET | Freq: Four times a day (QID) | ORAL | Status: DC | PRN
  Administered 2016-10-03: 4 mg via ORAL
  Filled 2016-09-26: qty 1

## 2016-09-26 MED ORDER — LORAZEPAM 1 MG PO TABS: 1.0000 mg | ORAL_TABLET | Freq: Four times a day (QID) | ORAL | Status: AC | PRN

## 2016-09-26 MED ORDER — DEXTROSE 5 % IV SOLN
2.0000 g | Freq: Two times a day (BID) | INTRAVENOUS | Status: DC
  Administered 2016-09-27 – 2016-09-28 (×4): 2 g via INTRAVENOUS
  Filled 2016-09-26 (×5): qty 2

## 2016-09-26 MED ORDER — ADULT MULTIVITAMIN W/MINERALS CH
1.0000 | ORAL_TABLET | Freq: Every day | ORAL | Status: DC
  Administered 2016-09-27 – 2016-10-06 (×10): 1 via ORAL
  Filled 2016-09-26 (×11): qty 1

## 2016-09-26 MED ORDER — APIXABAN 5 MG PO TABS: 5.0000 mg | ORAL_TABLET | Freq: Two times a day (BID) | ORAL | Status: DC

## 2016-09-26 MED ORDER — VITAMIN B-1 100 MG PO TABS
100.0000 mg | ORAL_TABLET | Freq: Every day | ORAL | Status: DC
  Administered 2016-09-27 – 2016-10-06 (×9): 100 mg via ORAL
  Filled 2016-09-26 (×12): qty 1

## 2016-09-26 MED ORDER — IOPAMIDOL (ISOVUE-M 200) INJECTION 41%
INTRAMUSCULAR | Status: AC
  Filled 2016-09-26: qty 10

## 2016-09-26 MED ORDER — ACYCLOVIR SODIUM 50 MG/ML IV SOLN
10.0000 mg/kg | Freq: Once | INTRAVENOUS | Status: AC
  Administered 2016-09-26: 815 mg via INTRAVENOUS
  Filled 2016-09-26: qty 16.3

## 2016-09-26 MED ORDER — SODIUM CHLORIDE 0.9 % IV SOLN: INTRAVENOUS | Status: DC

## 2016-09-26 MED ORDER — SODIUM CHLORIDE 0.9 % IJ SOLN
INTRAMUSCULAR | Status: AC
  Filled 2016-09-26: qty 10

## 2016-09-26 MED ORDER — ONDANSETRON HCL 4 MG/2ML IJ SOLN: 4.0000 mg | Freq: Four times a day (QID) | INTRAMUSCULAR | Status: DC | PRN

## 2016-09-26 MED ORDER — ACETAMINOPHEN 650 MG RE SUPP: 650.0000 mg | Freq: Four times a day (QID) | RECTAL | Status: DC | PRN

## 2016-09-26 MED ORDER — THIAMINE HCL 100 MG/ML IJ SOLN
100.0000 mg | Freq: Every day | INTRAMUSCULAR | Status: DC
  Administered 2016-09-28 – 2016-09-29 (×2): 100 mg via INTRAVENOUS
  Filled 2016-09-26 (×2): qty 2

## 2016-09-26 MED ORDER — FOLIC ACID 1 MG PO TABS
1.0000 mg | ORAL_TABLET | Freq: Every day | ORAL | Status: DC
  Administered 2016-09-27 – 2016-10-06 (×10): 1 mg via ORAL
  Filled 2016-09-26 (×10): qty 1

## 2016-09-26 NOTE — Progress Notes (Signed)
Pharmacy Antibiotic Note  Phillip Mcdowell is a 36 y.o. male who presented on 09/26/2016 with back pain, B/L LE weakness concerning for CNS infection/meningitis with imaging showing possible infectious myositis. Pharmacy consulted to start Vancomycin along with Rocephin per MD.   The patient received Vancomycin 1g earlier today at 1130. Wt: 81.6 kg, SCr 0.97, CrCl~100 ml/min.   Plan: 1. Start Vancomycin 1g IV every 8 hours 2. Continue Rocephin 2g IV every 12 hours per MD 3. Will continue to follow renal function, culture results, LOT, and antibiotic de-escalation plans   Height: 6\' 1"  (185.4 cm) Weight: 180 lb (81.6 kg) IBW/kg (Calculated) : 79.9  Temp (24hrs), Avg:97.9 F (36.6 C), Min:97.7 F (36.5 C), Max:98.1 F (36.7 C)   Recent Labs Lab 09/26/16 0924 09/26/16 0935  WBC 18.5*  --   CREATININE 0.97  --   LATICACIDVEN  --  1.70    Estimated Creatinine Clearance: 121.3 mL/min (by C-G formula based on SCr of 0.97 mg/dL).    Allergies  Allergen Reactions  . Penicillins Swelling    Has patient had a PCN reaction causing immediate rash, facial/tongue/throat swelling, SOB or lightheadedness with hypotension: Yes Has patient had a PCN reaction causing severe rash involving mucus membranes or skin necrosis: No Has patient had a PCN reaction that required hospitalization: No Has patient had a PCN reaction occurring within the last 10 years: Yes If all of the above answers are "NO", then may proceed with Cephalosporin use.   . Sulfa Antibiotics Other (See Comments)    CHILDHOOD REACTION    Antimicrobials this admission: Vanc 8/5 >> Rocephin 8/5 >> Acyclovir 8/5 >>  Dose adjustments this admission: n/a  Microbiology results: 8/5 BCx >>  Thank you for allowing pharmacy to be a part of this patient's care.  Georgina PillionElizabeth Wyble, PharmD, BCPS Clinical Pharmacist Pager: [phone] Clinical phone for 09/26/2016 from 7a-3:30p: [phone] If after 3:30p, please call main pharmacy at:  [phone] 09/26/2016 5:24 PM

## 2016-09-26 NOTE — ED Notes (Signed)
Per MD not further I-stat lactic acid needs to be drawn at this time due to first normal result.

## 2016-09-26 NOTE — ED Provider Notes (Signed)
[facility]-EMERGENCY DEPT Provider Note   CSN: [id number] Arrival date & time: 09/26/16  4098     History   Chief Complaint Chief Complaint  Patient presents with  . Back Pain    HPI Phillip Mcdowell is a 36 y.o. male.  HPI This is a 36 year old male with a history of bipolar disorder, substance abuse, who presents today with complaints of pain all over. On my initial evaluation, the patient was here alone and most history was obtained by EMS and nursing report. They stated that the aunt and grandmother had called. Patient was unable to give me much history. He states that he hurts all over but does not recall a timeframe or what happened during the past 24 hours. He states that his family must call to EMS. He denies any injury but states he does not clearly remember what happened. Later in the evaluation, the aunt and grandmother arrived and state that he has been unable to walk. Past Medical History:  Diagnosis Date  . Bipolar 1 disorder (HCC)   . Panic attack   . Schizophrenia (HCC)     There are no active problems to display for this patient.   Past Surgical History:  Procedure Laterality Date  . CHOLECYSTECTOMY         Home Medications    Prior to Admission medications   Medication Sig Start Date End Date Taking? Authorizing Provider  ALPRAZolam Prudy Feeler) 0.5 MG tablet Take 0.5-1 mg by mouth 4 (four) times daily as needed for anxiety.    [provider]  dexamethasone (DECADRON) 4 MG tablet Take 1 tablet (4 mg total) by mouth 2 (two) times daily with a meal. 09/07/14   Ivery Quale, PA-C  diclofenac (VOLTAREN) 75 MG EC tablet Take 1 tablet (75 mg total) by mouth 2 (two) times daily. 09/07/14   Ivery Quale, PA-C    Family History No family history on file.  Social History Social History  Substance Use Topics  . Smoking status: Current Every Day Smoker    Packs/day: 0.50    Types: Cigarettes  . Smokeless tobacco: Never Used  . Alcohol use Yes     Comment:  rare     Allergies   Penicillins and Sulfa antibiotics   Review of Systems Review of Systems  Constitutional: Positive for activity change. Negative for chills and fever.  HENT: Negative.   Eyes: Negative.   Respiratory: Negative.   Cardiovascular: Negative.   Gastrointestinal: Positive for abdominal pain.  Endocrine: Negative.   Genitourinary: Negative for difficulty urinating, discharge, dysuria, flank pain and genital sores.  Musculoskeletal: Positive for arthralgias, back pain and myalgias.  Skin: Negative.   Allergic/Immunologic: Negative.   Hematological: Negative.   All other systems reviewed and are negative.    Physical Exam Updated Vital Signs BP 124/90   Pulse 100   Temp 97.7 F (36.5 C) (Oral)   Resp (!) 21   Ht 1.854 m (6\' 1" )   Wt 81.6 kg (180 lb)   SpO2 100%   BMI 23.75 kg/m   Physical Exam  Constitutional: He appears well-developed and well-nourished. No distress.  HENT:  Head: Normocephalic and atraumatic.  Right Ear: External ear normal.  Left Ear: External ear normal.  Eyes: Pupils are equal, round, and reactive to light. EOM are normal.  Neck: Normal range of motion.  Cardiovascular: Tachycardia present.   Pulmonary/Chest: Effort normal and breath sounds normal.  Abdominal: Soft. Bowel sounds are normal.  Musculoskeletal: He exhibits tenderness. He exhibits  no edema or deformity.       Legs: Complains of pain with any palpation  Neurological:  Decreased strenght bilateral lower extremities- hip flexion, knee extension and toes dtr 3+ bilateral patella Sensation intact lower extremities including saddle area Refuses rectal exam   Skin:     Erythematous well circumscribed 6 x 6 cm area right buttock, no fluctuance-suspicious for pattern imprint  Nursing note and vitals reviewed.    ED Treatments / Results  Labs (all labs ordered are listed, but only abnormal results are displayed) Labs Reviewed  CULTURE, BLOOD (ROUTINE X 2)    CULTURE, BLOOD (ROUTINE X 2)  CBC WITH DIFFERENTIAL/PLATELET  COMPREHENSIVE METABOLIC PANEL  URINALYSIS, ROUTINE W REFLEX MICROSCOPIC  RAPID URINE DRUG SCREEN, HOSP PERFORMED  ETHANOL  BLOOD GAS, VENOUS  TROPONIN I  CK TOTAL AND CKMB (NOT AT [facility])  I-STAT CG4 LACTIC ACID, ED    EKG  EKG Interpretation  Date/Time:  Sunday September 26 2016 07:34:11 EDT Ventricular Rate:  96 PR Interval:    QRS Duration: 88 QT Interval:  357 QTC Calculation: 452 R Axis:   66 Text Interpretation:  Normal sinus rhythm T wave inversion v1-v3 No old tracing to compare Confirmed by Margarita Grizzleay, Abbi Mancini [phone]([id number]) on 09/26/2016 9:28:27 AM       Radiology Ct Thoracic Spine Wo Contrast  Result Date: 09/26/2016 CLINICAL DATA:  Suspected fall yesterday.  Limited history. EXAM: CT THORACIC AND LUMBAR SPINE WITHOUT CONTRAST TECHNIQUE: Multidetector CT imaging of the thoracic and lumbar spine was performed without contrast. Multiplanar CT image reconstructions were also generated. COMPARISON:  None. FINDINGS: CT THORACIC SPINE FINDINGS Alignment: Mild exaggerated kyphosis.  No traumatic malalignment. Vertebrae: Negative for fracture or bone lesion. Paraspinal and other soft tissues: No acute finding. 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 Disc levels: No evidence of degenerative impingement. Ligamentum flavum thickening in ossification at T4-5 and T5-6. CT LUMBAR SPINE FINDINGS Segmentation: 5 lumbar type vertebrae. Alignment: Normal. Vertebrae: No acute fracture or focal pathologic process. Paraspinal and other soft tissues: Mild retroperitoneal edema centered in the central and left upper quadrant. The pancreatic tail appears somewhat thickened and indistinct diffusely. Disc levels: No significant degenerative changes or evidence of impingement. IMPRESSION: CT THORACIC SPINE IMPRESSION No evidence of injury. CT LUMBAR SPINE IMPRESSION 1. No evidence of lumbar spine injury. 2. Mild retroperitoneal edema and pancreatic tail  thickening, please correlate for pancreatitis by exam and labs. Electronically Signed   By: Marnee SpringJonathon  Watts M.D.   On: 09/26/2016 10:58   Ct Lumbar Spine Wo Contrast  Result Date: 09/26/2016 CLINICAL DATA:  Suspected fall yesterday.  Limited history. EXAM: CT THORACIC AND LUMBAR SPINE WITHOUT CONTRAST TECHNIQUE: Multidetector CT imaging of the thoracic and lumbar spine was performed without contrast. Multiplanar CT image reconstructions were also generated. COMPARISON:  None. FINDINGS: CT THORACIC SPINE FINDINGS Alignment: Mild exaggerated kyphosis.  No traumatic malalignment. Vertebrae: Negative for fracture or bone lesion. Paraspinal and other soft tissues: No acute finding. 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 Disc levels: No evidence of degenerative impingement. Ligamentum flavum thickening in ossification at T4-5 and T5-6. CT LUMBAR SPINE FINDINGS Segmentation: 5 lumbar type vertebrae. Alignment: Normal. Vertebrae: No acute fracture or focal pathologic process. Paraspinal and other soft tissues: Mild retroperitoneal edema centered in the central and left upper quadrant. The pancreatic tail appears somewhat thickened and indistinct diffusely. Disc levels: No significant degenerative changes or evidence of impingement. IMPRESSION: CT THORACIC SPINE IMPRESSION No evidence of injury. CT LUMBAR SPINE IMPRESSION 1. No  evidence of lumbar spine injury. 2. Mild retroperitoneal edema and pancreatic tail thickening, please correlate for pancreatitis by exam and labs. Electronically Signed   By: Marnee SpringJonathon  Watts M.D.   On: 09/26/2016 10:58   Dg Chest Port 1 View  Result Date: 09/26/2016 CLINICAL DATA:  Pain. EXAM: PORTABLE CHEST 1 VIEW COMPARISON:  None. FINDINGS: Normal heart size and mediastinal contours. No acute infiltrate or edema. No effusion or pneumothorax. No acute osseous findings. IMPRESSION: Negative portable chest. Electronically Signed   By: Marnee SpringJonathon  Watts M.D.   On: 09/26/2016 09:25     Procedures Procedures (including critical care time)  Medications Ordered in ED Medications  sodium chloride 0.9 % bolus 1,000 mL (1,000 mLs Intravenous New Bag/Given 09/26/16 0737)     Initial Impression / Assessment and Plan / ED Course  I have reviewed the triage vital signs and the nursing notes.  Pertinent labs & imaging results that were available during my care of the patient were reviewed by me and considered in my medical decision making (see chart for details).  Clinical Course as of Sep 27 1103  Sun Sep 26, 2016  1103 noted WBC: (!) 18.5 [DR]    Clinical Course User Index [DR] Margarita Grizzleay, Landers Prajapati, MD    Patient insistent that he wants to leave- patietn unable to walk.   10:16 AM Positive troponin reported at 0.58- patient with diffuse pain, no specific chest pain, ekg abnormal - no old, but no evidence of stemi,  11:02 AM Awaiting ct results. Patient receiving iv fluids- has not voided yet. Leukocytosis noted Hyponatremia noted Elevated troponin Elevated lft Abnormal head ct with hippocampal edema Retroperitoneal edema-lipase added Multifocal muscular edema  Left hip joint effusion Abnormal ekg Rhabdomyolysis with CK elevated at 36,000-discussed with Dr. Irene LimboGoodrich 36 y.o. Male ho substance abuse presents today with nonspecific complaints of hurting all over and inability to walk.  Patient with bilateral lower extremity weakness for approximately 36 hours by family report although onset of symptoms unclear- aunt reports unable to walk when got up yesterday morning.  Unclear if associated fall.  11:13 AM Discussed head ct with radiology- report hippocampal edema with etiologies including infection, drug use- plan discuss with neurology.  Discussed lp with radiology who does feel it is safe to tap without risk of herniation but that mri brain may well define lesion.  Will consider- abx treatment and antiviral regimen LP- will discuss with neuro Will need [facility]  Extensive discussion of case with Drs. Irene LimboGoodrich and Publishing rights managerAuror.   CRITICAL CARE Performed by: Hilario QuarryAY,Adamariz Gillott S Total critical care time: 100 minutes Critical care time was exclusive of separately billable procedures and treating other patients. Critical care was necessary to treat or prevent imminent or life-threatening deterioration. Critical care was time spent personally by me on the following activities: development of treatment plan with patient and/or surrogate as well as nursing, discussions with consultants, evaluation of patient's response to treatment, examination of patient, obtaining history from patient or surrogate, ordering and performing treatments and interventions, ordering and review of laboratory studies, ordering and review of radiographic studies, pulse oximetry and re-evaluation of patient's condition.   Final Clinical Impressions(s) / ED Diagnoses   Final diagnoses:  Abnormal head CT  Weakness  Leukocytosis, unspecified type  Hyponatremia  Elevated troponin  Abnormal EKG    New Prescriptions New Prescriptions   No medications on file     Margarita Grizzleay, Davit Vassar, MD 09/26/16 1439

## 2016-09-26 NOTE — ED Notes (Signed)
Pt refused rectal temperature, stated "you're not stickin anything in my butt". Pt given urinal for UA.

## 2016-09-26 NOTE — Consult Note (Addendum)
NEURO HOSPITALIST CONSULT NOTE   Requesting physician: Dr. Irene Limbo  Reason for Consult: leg numbness  History obtained from:  Family, chart  HPI:                                                                                                                                          Phillip Mcdowell is an 36 y.o. male with a history of psychiatric conditions, polysubstance abuse including heroin, pills and alcohol, was transferred to [facility] from [facility] following complaints of leg numbness and inability to walk. Mom reports that he has fallen twice within the past week and maybe once weekly in the few weeks prior. His most recent fall was 09/25/16. Per report,  "Aunt reports she found him on the floor of his room the morning of 8/4, at that time he was unable to move his legs.Marland KitchenMarland KitchenThe patient was given a jug to urinate and and some liquids to drink. He was unable to leave his bed from that point." Phillip Mcdowell does not recall falling at all in the past.   Phillip Mcdowell presented to [facility] the morning of 09/26/16 for evaluation. Multiple medical abnormalities were identified on arrival including elevated LFTs, leukocytosis, pancreatits and myositis of the pelvic muscles. In particular, on CT, the abnormal appearance of the temporal horns of the lateral ventricles are concerning for meningitis vs drug use. He was subsequently transferred to [facility] for additional evaluation and treatment.  On my visit, Phillip Mcdowell was in bed sleeping. He would engage in conversation and follow some simple commands if kept stimulated. He states that he hurts "all over" and just "want[s] to sleep". Contribution to exam was limited by pain and effort.  His mom reports that he is easily agitated and will lash out.   Past Medical History:  Diagnosis Date  . Bipolar 1 disorder (HCC)   . Panic attack   . Schizophrenia [facility])     Past Surgical History:  Procedure Laterality Date  . CHOLECYSTECTOMY      Family History   Problem Relation Age of Onset  . Brain cancer Paternal Grandfather     Social History:  reports that he has been smoking Cigarettes.  He has been smoking about 0.50 packs per day. He has never used smokeless tobacco. He reports that he drinks alcohol. He reports that he uses drugs, including Marijuana.  Allergies  Allergen Reactions  . Penicillins Swelling    Has patient had a PCN reaction causing immediate rash, facial/tongue/throat swelling, SOB or lightheadedness with hypotension: Yes Has patient had a PCN reaction causing severe rash involving mucus membranes or skin necrosis: No Has patient had a PCN reaction that required hospitalization: No Has patient had a PCN reaction occurring within the last 10 years: Yes If all of the  above answers are "NO", then may proceed with Cephalosporin use.   . Sulfa Antibiotics Other (See Comments)    CHILDHOOD REACTION    MEDICATIONS:                                                                                                                   Current Meds  Medication Sig  . ALPRAZolam (XANAX) 0.5 MG tablet Take 0.5-1 mg by mouth 4 (four) times daily as needed for anxiety.     Review Of Systems:                                                                                                           History obtained from unobtainable from patient due to mental status  Blood pressure 136/88, pulse 92, temperature 98.5 F (36.9 C), temperature source Oral, resp. rate 19, height 6\' 1"  (1.854 m), weight 81.6 kg (180 lb), SpO2 99 %.   Physical Examination:                                                                                                      General: WDWN male. Appears calm and comfortable in bed HEENT:  Normocephalic, no lesions, without obvious abnormality.  Normal external eye and conjunctiva.  Normal external ears. Normal external nose, mucus membranes and septum.  Normal pharynx. Cardiovascular: S1, S2 normal, pulses  palpable throughout   Pulmonary: Unlabored breathing Abdomen: tender Musculoskeletal/Extremities: no joint deformities, effusion, or inflammation .Tone and bulk normal throughout; no atrophy noted Skin: warm and dry, no hyperpigmentation, vitiligo, or suspicious lesions  Neurological Examination:                                                                                               Mental Status:  Phillip Mcdowell is alert, oriented to hospital, not year or month.  Speech dysarthric without evidence of aphasia. Able to follow simple commands without difficulty. Cranial Nerves: II: Visual fields grossly normal, pupils are equal, round, reactive to light . III,IV, VI: Ptosis not present, extra-ocular muscle movements intact bilaterally V,VII: Face is symmetric. Facial light touch intact bilaterally VIII: Hearing grossly intact IX,X: Uvula and palate rise symmetrically XI: SCM and bilateral shoulder shrug strength 4/5 XII: Midline tongue extension Motor: Limited by effort and pain Right :     Upper extremity   4-/5   Left:     Upper extremity   4-/5          Lower extremity   2/5    Lower extremity   2/5 Lower extremities - range of motion intact, but only able to keep ankle hovering off bed for 5-6 seconds. Pronator drift not present Sensory: Pinprick and light touch intact throughout, bilaterally Deep Tendon Reflexes: 2+ and symmetric throughout BUE and RLE. 3+ LLE with 7 beat clonus. Plantars: Right: downgoing   Left: downgoing Cerebellar: He would not perform Gait: Deferred   Lab Results: Basic Metabolic Panel:  Recent Labs Lab 09/26/16 0924  NA 128*  K 4.0  CL 99*  CO2 21*  GLUCOSE 149*  BUN 12  CREATININE 0.97  CALCIUM 8.6*    Liver Function Tests:  Recent Labs Lab 09/26/16 0924  AST 719*  ALT 231*  ALKPHOS 93  BILITOT 1.6*  PROT 6.8  ALBUMIN 3.4*    Recent Labs Lab 09/26/16 1113  LIPASE 210*   No results for input(s): AMMONIA in the last 168  hours.  CBC:  Recent Labs Lab 09/26/16 0924  WBC 18.5*  NEUTROABS 15.5*  HGB 18.1*  HCT 50.5  MCV 94.7  PLT 185    Cardiac Enzymes:  Recent Labs Lab 09/26/16 0924 09/26/16 1113 09/26/16 1545  CKTOTAL  --  36,747*  --   TROPONINI 0.58*  --  0.57*    Lipid Panel: No results for input(s): CHOL, TRIG, HDL, CHOLHDL, VLDL, LDLCALC in the last 168 hours.  CBG: No results for input(s): GLUCAP in the last 168 hours.  Microbiology: Results for orders placed or performed during the hospital encounter of 09/26/16  Blood culture (routine x 2)     Status: None (Preliminary result)   Collection Time: 09/26/16  9:25 AM  Result Value Ref Range Status   Specimen Description   Final    BLOOD BLOOD RIGHT WRIST BOTTLES DRAWN AEROBIC AND ANAEROBIC   Special Requests   Final    Blood Culture results may not be optimal due to an inadequate volume of blood received in culture bottles   Culture NO GROWTH < 12 HOURS  Final   Report Status PENDING  Incomplete    Coagulation Studies: No results for input(s): LABPROT, INR in the last 72 hours.  Imaging: Ct Head Wo Contrast  Result Date: 09/26/2016 CLINICAL DATA:  Numbness in legs.  Found down. EXAM: CT HEAD WITHOUT CONTRAST CT CERVICAL SPINE WITHOUT CONTRAST TECHNIQUE: Multidetector CT imaging of the head and cervical spine was performed following the standard protocol without intravenous contrast. Multiplanar CT image reconstructions of the cervical spine were also generated. COMPARISON:  None. FINDINGS: CT HEAD FINDINGS Brain: Ventricles are normal in size and configuration. However, temporal horns of the lateral ventricles have an abnormal appearance bilaterally, perhaps indicating adjacent hippocampal edema and/or debris related to underlying meningitis. Remainder of the brain demonstrate normal gray-white matter  attenuation. No parenchymal or extra-axial hemorrhage. Vascular: No hyperdense vessel or unexpected calcification. Skull: Normal.  Negative for fracture or focal lesion. Sinuses/Orbits: No acute finding. Other: None. CT CERVICAL SPINE FINDINGS Alignment: Dextroscoliosis. No evidence of acute vertebral body subluxation. Skull base and vertebrae: No fracture line or displaced fracture fragment identified. Facet joints appear intact and normal in alignment. Soft tissues and spinal canal: No prevertebral fluid or swelling. No visible canal hematoma. Disc levels: Disc spaces are well preserved throughout. No significant central canal stenosis at any level. Upper chest: Mild emphysematous change at the lung apices. No acute findings. Other: None. IMPRESSION: 1. Temporal horns of the lateral ventricles have an abnormal appearance bilaterally, perhaps indicating adjacent hippocampal edema and/or debris related to underlying meningitis. This appearance can also be related to drug use. Recommend brain MRI with contrast for further characterization. 2. No fracture or acute subluxation within the cervical spine. Scoliosis. 3. Emphysema. These results were called by telephone at the time of interpretation on 09/26/2016 at 11:11 am to Dr. Margarita Grizzle , who verbally acknowledged these results. Electronically Signed   By: Bary Richard M.D.   On: 09/26/2016 11:12   Ct Cervical Spine Wo Contrast  Result Date: 09/26/2016 CLINICAL DATA:  Numbness in legs.  Found down. EXAM: CT HEAD WITHOUT CONTRAST CT CERVICAL SPINE WITHOUT CONTRAST TECHNIQUE: Multidetector CT imaging of the head and cervical spine was performed following the standard protocol without intravenous contrast. Multiplanar CT image reconstructions of the cervical spine were also generated. COMPARISON:  None. FINDINGS: CT HEAD FINDINGS Brain: Ventricles are normal in size and configuration. However, temporal horns of the lateral ventricles have an abnormal appearance bilaterally, perhaps indicating adjacent hippocampal edema and/or debris related to underlying meningitis. Remainder of the brain  demonstrate normal gray-white matter attenuation. No parenchymal or extra-axial hemorrhage. Vascular: No hyperdense vessel or unexpected calcification. Skull: Normal. Negative for fracture or focal lesion. Sinuses/Orbits: No acute finding. Other: None. CT CERVICAL SPINE FINDINGS Alignment: Dextroscoliosis. No evidence of acute vertebral body subluxation. Skull base and vertebrae: No fracture line or displaced fracture fragment identified. Facet joints appear intact and normal in alignment. Soft tissues and spinal canal: No prevertebral fluid or swelling. No visible canal hematoma. Disc levels: Disc spaces are well preserved throughout. No significant central canal stenosis at any level. Upper chest: Mild emphysematous change at the lung apices. No acute findings. Other: None. IMPRESSION: 1. Temporal horns of the lateral ventricles have an abnormal appearance bilaterally, perhaps indicating adjacent hippocampal edema and/or debris related to underlying meningitis. This appearance can also be related to drug use. Recommend brain MRI with contrast for further characterization. 2. No fracture or acute subluxation within the cervical spine. Scoliosis. 3. Emphysema. These results were called by telephone at the time of interpretation on 09/26/2016 at 11:11 am to Dr. Margarita Grizzle , who verbally acknowledged these results. Electronically Signed   By: Bary Richard M.D.   On: 09/26/2016 11:12   Ct Thoracic Spine Wo Contrast  Result Date: 09/26/2016 CLINICAL DATA:  Suspected fall yesterday.  Limited history. EXAM: CT THORACIC AND LUMBAR SPINE WITHOUT CONTRAST TECHNIQUE: Multidetector CT imaging of the thoracic and lumbar spine was performed without contrast. Multiplanar CT image reconstructions were also generated. COMPARISON:  None. FINDINGS: CT THORACIC SPINE FINDINGS Alignment: Mild exaggerated kyphosis.  No traumatic malalignment. Vertebrae: Negative for fracture or bone lesion. Paraspinal and other soft tissues: No  acute finding. 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 Disc levels: No evidence of degenerative impingement. Ligamentum flavum  thickening in ossification at T4-5 and T5-6. CT LUMBAR SPINE FINDINGS Segmentation: 5 lumbar type vertebrae. Alignment: Normal. Vertebrae: No acute fracture or focal pathologic process. Paraspinal and other soft tissues: Mild retroperitoneal edema centered in the central and left upper quadrant. The pancreatic tail appears somewhat thickened and indistinct diffusely. Disc levels: No significant degenerative changes or evidence of impingement. IMPRESSION: CT THORACIC SPINE IMPRESSION No evidence of injury. CT LUMBAR SPINE IMPRESSION 1. No evidence of lumbar spine injury. 2. Mild retroperitoneal edema and pancreatic tail thickening, please correlate for pancreatitis by exam and labs. Electronically Signed   By: Marnee Spring M.D.   On: 09/26/2016 10:58   Ct Lumbar Spine Wo Contrast  Result Date: 09/26/2016 CLINICAL DATA:  Suspected fall yesterday.  Limited history. EXAM: CT THORACIC AND LUMBAR SPINE WITHOUT CONTRAST TECHNIQUE: Multidetector CT imaging of the thoracic and lumbar spine was performed without contrast. Multiplanar CT image reconstructions were also generated. COMPARISON:  None. FINDINGS: CT THORACIC SPINE FINDINGS Alignment: Mild exaggerated kyphosis.  No traumatic malalignment. Vertebrae: Negative for fracture or bone lesion. Paraspinal and other soft tissues: No acute finding. 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 Disc levels: No evidence of degenerative impingement. Ligamentum flavum thickening in ossification at T4-5 and T5-6. CT LUMBAR SPINE FINDINGS Segmentation: 5 lumbar type vertebrae. Alignment: Normal. Vertebrae: No acute fracture or focal pathologic process. Paraspinal and other soft tissues: Mild retroperitoneal edema centered in the central and left upper quadrant. The pancreatic tail appears somewhat thickened and indistinct diffusely. Disc levels: No  significant degenerative changes or evidence of impingement. IMPRESSION: CT THORACIC SPINE IMPRESSION No evidence of injury. CT LUMBAR SPINE IMPRESSION 1. No evidence of lumbar spine injury. 2. Mild retroperitoneal edema and pancreatic tail thickening, please correlate for pancreatitis by exam and labs. Electronically Signed   By: Marnee Spring M.D.   On: 09/26/2016 10:58   Ct Pelvis Wo Contrast  Result Date: 09/26/2016 CLINICAL DATA:  Bilateral lower extremity numbness. The patient is unable to walk. Possible fall yesterday. Elevated white blood cell count. EXAM: CT PELVIS WITHOUT CONTRAST TECHNIQUE: Multidetector CT imaging of the pelvis was performed following the standard protocol without intravenous contrast. COMPARISON:  None. FINDINGS: Urinary Tract:  Negative. Bowel:  Imaged bowel loops and the appendix appear normal. Vascular/Lymphatic: No pathologically enlarged lymph nodes. No significant vascular abnormality seen. Reproductive:  Negative. Other:  No intra-abdominal fluid collection. Musculoskeletal: Multifocal muscular edema is identified. Involved muscles include the right gluteus maximus and medius, left gluteus medius and minimus and left obturator externus. Left hip joint effusion is identified. Subcutaneous stranding is seen about the right buttock and left greater trochanter. No bony destructive change or periosteal reaction is identified. No avascular necrosis of the femoral heads. Small synovial herniation pit about both hips are noted. IMPRESSION: Multifocal muscle edema about the pelvis most most worrisome for infectious myositis in this patient with an elevated white blood cell count. Rhabdomyolysis is possible. Left hip joint effusion worrisome for septic joint given elevated white count. No evidence of osteomyelitis. Subcutaneous stranding about the left hip and right buttock likely due to cellulitis. Electronically Signed   By: Drusilla Kanner M.D.   On: 09/26/2016 11:17   Dg Chest  Port 1 View  Result Date: 09/26/2016 CLINICAL DATA:  Pain. EXAM: PORTABLE CHEST 1 VIEW COMPARISON:  None. FINDINGS: Normal heart size and mediastinal contours. No acute infiltrate or edema. No effusion or pneumothorax. No acute osseous findings. IMPRESSION: Negative portable chest. Electronically Signed   By: Marnee Spring  M.D.   On: 09/26/2016 09:25     Thank you for consulting the [facility] team. Assessment and plan per attending neurologist.   Bruna PotterJamie Aldridge PA-C [facility]  09/26/2016, 6:21 PM  Assessment and Plan:  36 year old man with a history of bipolar disorder and schizophrenia as well as polysubstance abuse presenting with multiple medical problems including encephalopathic state, acute myositis with rhabdomyolysis and abscess involving his left hip. Patient CT scan showed changes involving his temporal horns that were suggestive of possible debris associated with acute meningitis, or use of illicit drugs. Patient is afebrile and had no signs of meningeal irritation on clinical exam.  Recommendations: 1. Agree with obtaining MRI of the brain without and with contrast 2. EEG to assess severity of encephalopathy 3. Serum ammonia level. 4. Agree with establishing HIV status 5. Consider infectious diseases consult 6. Defer LP for now since patient is afebrile and has no clinical signs of acute meningitis  We will continue to follow this patient with you.  I personally participated in this patient's evaluation and management, including clinical examination, as well as formulating the above clinical assessment and management recommendations.  Venetia MaxonR Yania Bogie M.D. [facility] [phone]

## 2016-09-26 NOTE — Consult Note (Signed)
Reason for Consult: Left hip effusion Referring Physician: [name] is an 36 y.o. male.  HPI: Phillip Mcdowell is a 36 year old patient with complicated past medical history who was found on the floor today.  He is evaluated at [facility] and is currently in the process of a workup for multiple medical issues.  Among them include possible pancreatitis as well as elevated troponin.  On his workup he was noted to have left hip effusion and stranding in the muscles.  He is a poor historian currently and does not give a definite history of trauma.  He denies any other orthopedic complaints but generally says that everything hurts.  Past Medical History:  Diagnosis Date  . Bipolar 1 disorder ([name])   . Panic attack   . Schizophrenia [facility])     Past Surgical History:  Procedure Laterality Date  . CHOLECYSTECTOMY      Family History  Problem Relation Age of Onset  . Brain cancer Paternal Grandfather     Social History:  reports that he has been smoking Cigarettes.  He has been smoking about 0.50 packs per day. He has never used smokeless tobacco. He reports that he drinks alcohol. He reports that he uses drugs, including Marijuana.  Allergies:  Allergies  Allergen Reactions  . Penicillins Swelling    Has patient had a PCN reaction causing immediate rash, facial/tongue/throat swelling, SOB or lightheadedness with hypotension: Yes Has patient had a PCN reaction causing severe rash involving mucus membranes or skin necrosis: No Has patient had a PCN reaction that required hospitalization: No Has patient had a PCN reaction occurring within the last 10 years: Yes If all of the above answers are "NO", then may proceed with Cephalosporin use.   . Sulfa Antibiotics Other (See Comments)    CHILDHOOD REACTION    Medications: I have reviewed the patient's current medications.  Results for orders placed or performed during the hospital encounter of 09/26/16 (from the past 48  hour(s))  Ethanol     Status: None   Collection Time: 09/26/16  8:41 AM  Result Value Ref Range   Alcohol, Ethyl (B) <5 <5 mg/dL    Comment:        LOWEST DETECTABLE LIMIT FOR SERUM ALCOHOL IS 5 mg/dL FOR MEDICAL PURPOSES ONLY   CBC with Differential/Platelet     Status: Abnormal   Collection Time: 09/26/16  9:24 AM  Result Value Ref Range   WBC 18.5 (H) 4.0 - 10.5 K/uL    Comment: WHITE COUNT CONFIRMED ON SMEAR   RBC 5.33 4.22 - 5.81 MIL/uL   Hemoglobin 18.1 (H) 13.0 - 17.0 g/dL   HCT 50.5 39.0 - 52.0 %   MCV 94.7 78.0 - 100.0 fL   MCH 34.0 26.0 - 34.0 pg   MCHC 35.8 30.0 - 36.0 g/dL   RDW 13.6 11.5 - 15.5 %   Platelets 185 150 - 400 K/uL    Comment: PLATELET COUNT CONFIRMED BY SMEAR SPECIMEN CHECKED FOR CLOTS    Neutrophils Relative % 84 %   Neutro Abs 15.5 (H) 1.7 - 7.7 K/uL   Lymphocytes Relative 7 %   Lymphs Abs 1.3 0.7 - 4.0 K/uL   Monocytes Relative 8 %   Monocytes Absolute 1.5 (H) 0.1 - 1.0 K/uL   Eosinophils Relative 1 %   Eosinophils Absolute 0.2 0.0 - 0.7 K/uL   Basophils Relative 0 %   Basophils Absolute 0.0 0.0 - 0.1 K/uL  Comprehensive metabolic panel  Status: Abnormal   Collection Time: 09/26/16  9:24 AM  Result Value Ref Range   Sodium 128 (L) 135 - 145 mmol/L   Potassium 4.0 3.5 - 5.1 mmol/L   Chloride 99 (L) 101 - 111 mmol/L   CO2 21 (L) 22 - 32 mmol/L   Glucose, Bld 149 (H) 65 - 99 mg/dL   BUN 12 6 - 20 mg/dL   Creatinine, Ser 0.97 0.61 - 1.24 mg/dL   Calcium 8.6 (L) 8.9 - 10.3 mg/dL   Total Protein 6.8 6.5 - 8.1 g/dL   Albumin 3.4 (L) 3.5 - 5.0 g/dL   AST 719 (H) 15 - 41 U/L   ALT 231 (H) 17 - 63 U/L   Alkaline Phosphatase 93 38 - 126 U/L   Total Bilirubin 1.6 (H) 0.3 - 1.2 mg/dL   GFR calc non Af Amer >60 >60 mL/min   GFR calc Af Amer >60 >60 mL/min    Comment: (NOTE) The eGFR has been calculated using the CKD EPI equation. This calculation has not been validated in all clinical situations. eGFR's persistently <60 mL/min signify  possible Chronic Kidney Disease.    Anion gap 8 5 - 15  Troponin I     Status: Abnormal   Collection Time: 09/26/16  9:24 AM  Result Value Ref Range   Troponin I 0.58 (HH) <0.03 ng/mL    Comment: CRITICAL RESULT CALLED TO, READ BACK BY AND VERIFIED WITH: MILLER,M AT 1012 ON 09/26/2016 BY MOSLEY,J   Blood culture (routine x 2)     Status: None (Preliminary result)   Collection Time: 09/26/16  9:25 AM  Result Value Ref Range   Specimen Description      BLOOD BLOOD RIGHT WRIST BOTTLES DRAWN AEROBIC AND ANAEROBIC   Special Requests      Blood Culture results may not be optimal due to an inadequate volume of blood received in culture bottles   Culture NO GROWTH < 12 HOURS    Report Status PENDING   Blood gas, arterial ([facility] & [facility] ONLY)     Status: None   Collection Time: 09/26/16  9:30 AM  Result Value Ref Range   FIO2 0.21    O2 Content 21.0 L/min   Delivery systems ROOM AIR    pH, Arterial 7.400 7.350 - 7.450   pCO2 arterial 37.8 32.0 - 48.0 mmHg   pO2, Arterial 89.4 83.0 - 108.0 mmHg   Bicarbonate 23.6 20.0 - 28.0 mmol/L   Acid-base deficit 1.2 0.0 - 2.0 mmol/L   O2 Saturation 96.8 %   Collection site RIGHT RADIAL    Drawn by [phone]    Sample type ARTERIAL    Allens test (pass/fail) PASS PASS  I-Stat CG4 Lactic Acid, ED     Status: None   Collection Time: 09/26/16  9:35 AM  Result Value Ref Range   Lactic Acid, Venous 1.70 0.5 - 1.9 mmol/L  Lipase, blood     Status: Abnormal   Collection Time: 09/26/16 11:13 AM  Result Value Ref Range   Lipase 210 (H) 11 - 51 U/L    Comment: SLIGHT HEMOLYSIS  CK     Status: Abnormal   Collection Time: 09/26/16 11:13 AM  Result Value Ref Range   Total CK 36,747 (H) 49 - 397 U/L    Comment: RESULTS CONFIRMED BY MANUAL DILUTION  Urinalysis, Routine w reflex microscopic     Status: Abnormal   Collection Time: 09/26/16 12:06 PM  Result Value Ref Range   Color,  Urine YELLOW YELLOW   APPearance HAZY (A) CLEAR   Specific Gravity, Urine 1.011  1.005 - 1.030   pH 5.0 5.0 - 8.0   Glucose, UA NEGATIVE NEGATIVE mg/dL   Hgb urine dipstick LARGE (A) NEGATIVE   Bilirubin Urine NEGATIVE NEGATIVE   Ketones, ur NEGATIVE NEGATIVE mg/dL   Protein, ur 100 (A) NEGATIVE mg/dL   Nitrite NEGATIVE NEGATIVE   Leukocytes, UA NEGATIVE NEGATIVE   RBC / HPF 0-5 0 - 5 RBC/hpf   WBC, UA 0-5 0 - 5 WBC/hpf   Bacteria, UA RARE (A) NONE SEEN   Squamous Epithelial / LPF 0-5 (A) NONE SEEN   Granular Casts, UA PRESENT    Sperm, UA PRESENT   Rapid urine drug screen (hospital performed)     Status: Abnormal   Collection Time: 09/26/16 12:06 PM  Result Value Ref Range   Opiates POSITIVE (A) NONE DETECTED   Cocaine NONE DETECTED NONE DETECTED   Benzodiazepines POSITIVE (A) NONE DETECTED   Amphetamines POSITIVE (A) NONE DETECTED   Tetrahydrocannabinol NONE DETECTED NONE DETECTED   Barbiturates NONE DETECTED NONE DETECTED    Comment:        DRUG SCREEN FOR MEDICAL PURPOSES ONLY.  IF CONFIRMATION IS NEEDED FOR ANY PURPOSE, NOTIFY LAB WITHIN 5 DAYS.        LOWEST DETECTABLE LIMITS FOR URINE DRUG SCREEN Drug Class       Cutoff (ng/mL) Amphetamine      1000 Barbiturate      200 Benzodiazepine   450 Tricyclics       388 Opiates          300 Cocaine          300 THC              50   Acetaminophen level     Status: Abnormal   Collection Time: 09/26/16  3:45 PM  Result Value Ref Range   Acetaminophen (Tylenol), Serum <10 (L) 10 - 30 ug/mL    Comment:        THERAPEUTIC CONCENTRATIONS VARY SIGNIFICANTLY. A RANGE OF 10-30 ug/mL MAY BE AN EFFECTIVE CONCENTRATION FOR MANY PATIENTS. HOWEVER, SOME ARE BEST TREATED AT CONCENTRATIONS OUTSIDE THIS RANGE. ACETAMINOPHEN CONCENTRATIONS >150 ug/mL AT 4 HOURS AFTER INGESTION AND >50 ug/mL AT 12 HOURS AFTER INGESTION ARE OFTEN ASSOCIATED WITH TOXIC REACTIONS.   Salicylate level     Status: None   Collection Time: 09/26/16  3:45 PM  Result Value Ref Range   Salicylate Lvl <8.2 2.8 - 30.0 mg/dL   Troponin I (q 6hr x 3)     Status: Abnormal   Collection Time: 09/26/16  3:45 PM  Result Value Ref Range   Troponin I 0.57 (HH) <0.03 ng/mL    Comment: CRITICAL VALUE NOTED.  VALUE IS CONSISTENT WITH PREVIOUSLY REPORTED AND CALLED VALUE.    Ct Head Wo Contrast  Result Date: 09/26/2016 CLINICAL DATA:  Numbness in legs.  Found down. EXAM: CT HEAD WITHOUT CONTRAST CT CERVICAL SPINE WITHOUT CONTRAST TECHNIQUE: Multidetector CT imaging of the head and cervical spine was performed following the standard protocol without intravenous contrast. Multiplanar CT image reconstructions of the cervical spine were also generated. COMPARISON:  None. FINDINGS: CT HEAD FINDINGS Brain: Ventricles are normal in size and configuration. However, temporal horns of the lateral ventricles have an abnormal appearance bilaterally, perhaps indicating adjacent hippocampal edema and/or debris related to underlying meningitis. Remainder of the brain demonstrate normal gray-white matter attenuation. No parenchymal or extra-axial hemorrhage. Vascular: No  hyperdense vessel or unexpected calcification. Skull: Normal. Negative for fracture or focal lesion. Sinuses/Orbits: No acute finding. Other: None. CT CERVICAL SPINE FINDINGS Alignment: Dextroscoliosis. No evidence of acute vertebral body subluxation. Skull base and vertebrae: No fracture line or displaced fracture fragment identified. Facet joints appear intact and normal in alignment. Soft tissues and spinal canal: No prevertebral fluid or swelling. No visible canal hematoma. Disc levels: Disc spaces are well preserved throughout. No significant central canal stenosis at any level. Upper chest: Mild emphysematous change at the lung apices. No acute findings. Other: None. IMPRESSION: 1. Temporal horns of the lateral ventricles have an abnormal appearance bilaterally, perhaps indicating adjacent hippocampal edema and/or debris related to underlying meningitis. This appearance can also be  related to drug use. Recommend brain MRI with contrast for further characterization. 2. No fracture or acute subluxation within the cervical spine. Scoliosis. 3. Emphysema. These results were called by telephone at the time of interpretation on 09/26/2016 at 11:11 am to Dr. Pattricia Boss , who verbally acknowledged these results. Electronically Signed   By: Franki Cabot M.D.   On: 09/26/2016 11:12   Ct Cervical Spine Wo Contrast  Result Date: 09/26/2016 CLINICAL DATA:  Numbness in legs.  Found down. EXAM: CT HEAD WITHOUT CONTRAST CT CERVICAL SPINE WITHOUT CONTRAST TECHNIQUE: Multidetector CT imaging of the head and cervical spine was performed following the standard protocol without intravenous contrast. Multiplanar CT image reconstructions of the cervical spine were also generated. COMPARISON:  None. FINDINGS: CT HEAD FINDINGS Brain: Ventricles are normal in size and configuration. However, temporal horns of the lateral ventricles have an abnormal appearance bilaterally, perhaps indicating adjacent hippocampal edema and/or debris related to underlying meningitis. Remainder of the brain demonstrate normal gray-white matter attenuation. No parenchymal or extra-axial hemorrhage. Vascular: No hyperdense vessel or unexpected calcification. Skull: Normal. Negative for fracture or focal lesion. Sinuses/Orbits: No acute finding. Other: None. CT CERVICAL SPINE FINDINGS Alignment: Dextroscoliosis. No evidence of acute vertebral body subluxation. Skull base and vertebrae: No fracture line or displaced fracture fragment identified. Facet joints appear intact and normal in alignment. Soft tissues and spinal canal: No prevertebral fluid or swelling. No visible canal hematoma. Disc levels: Disc spaces are well preserved throughout. No significant central canal stenosis at any level. Upper chest: Mild emphysematous change at the lung apices. No acute findings. Other: None. IMPRESSION: 1. Temporal horns of the lateral ventricles  have an abnormal appearance bilaterally, perhaps indicating adjacent hippocampal edema and/or debris related to underlying meningitis. This appearance can also be related to drug use. Recommend brain MRI with contrast for further characterization. 2. No fracture or acute subluxation within the cervical spine. Scoliosis. 3. Emphysema. These results were called by telephone at the time of interpretation on 09/26/2016 at 11:11 am to Dr. Pattricia Boss , who verbally acknowledged these results. Electronically Signed   By: Franki Cabot M.D.   On: 09/26/2016 11:12   Ct Thoracic Spine Wo Contrast  Result Date: 09/26/2016 CLINICAL DATA:  Suspected fall yesterday.  Limited history. EXAM: CT THORACIC AND LUMBAR SPINE WITHOUT CONTRAST TECHNIQUE: Multidetector CT imaging of the thoracic and lumbar spine was performed without contrast. Multiplanar CT image reconstructions were also generated. COMPARISON:  None. FINDINGS: CT THORACIC SPINE FINDINGS Alignment: Mild exaggerated kyphosis.  No traumatic malalignment. Vertebrae: Negative for fracture or bone lesion. Paraspinal and other soft tissues: No acute finding. 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 Disc levels: No evidence of degenerative impingement. Ligamentum flavum thickening in ossification at T4-5 and T5-6. CT LUMBAR  SPINE FINDINGS Segmentation: 5 lumbar type vertebrae. Alignment: Normal. Vertebrae: No acute fracture or focal pathologic process. Paraspinal and other soft tissues: Mild retroperitoneal edema centered in the central and left upper quadrant. The pancreatic tail appears somewhat thickened and indistinct diffusely. Disc levels: No significant degenerative changes or evidence of impingement. IMPRESSION: CT THORACIC SPINE IMPRESSION No evidence of injury. CT LUMBAR SPINE IMPRESSION 1. No evidence of lumbar spine injury. 2. Mild retroperitoneal edema and pancreatic tail thickening, please correlate for pancreatitis by exam and labs. Electronically Signed   By:  Monte Fantasia M.D.   On: 09/26/2016 10:58   Ct Lumbar Spine Wo Contrast  Result Date: 09/26/2016 CLINICAL DATA:  Suspected fall yesterday.  Limited history. EXAM: CT THORACIC AND LUMBAR SPINE WITHOUT CONTRAST TECHNIQUE: Multidetector CT imaging of the thoracic and lumbar spine was performed without contrast. Multiplanar CT image reconstructions were also generated. COMPARISON:  None. FINDINGS: CT THORACIC SPINE FINDINGS Alignment: Mild exaggerated kyphosis.  No traumatic malalignment. Vertebrae: Negative for fracture or bone lesion. Paraspinal and other soft tissues: No acute finding. 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 Disc levels: No evidence of degenerative impingement. Ligamentum flavum thickening in ossification at T4-5 and T5-6. CT LUMBAR SPINE FINDINGS Segmentation: 5 lumbar type vertebrae. Alignment: Normal. Vertebrae: No acute fracture or focal pathologic process. Paraspinal and other soft tissues: Mild retroperitoneal edema centered in the central and left upper quadrant. The pancreatic tail appears somewhat thickened and indistinct diffusely. Disc levels: No significant degenerative changes or evidence of impingement. IMPRESSION: CT THORACIC SPINE IMPRESSION No evidence of injury. CT LUMBAR SPINE IMPRESSION 1. No evidence of lumbar spine injury. 2. Mild retroperitoneal edema and pancreatic tail thickening, please correlate for pancreatitis by exam and labs. Electronically Signed   By: Monte Fantasia M.D.   On: 09/26/2016 10:58   Ct Pelvis Wo Contrast  Result Date: 09/26/2016 CLINICAL DATA:  Bilateral lower extremity numbness. The patient is unable to walk. Possible fall yesterday. Elevated white blood cell count. EXAM: CT PELVIS WITHOUT CONTRAST TECHNIQUE: Multidetector CT imaging of the pelvis was performed following the standard protocol without intravenous contrast. COMPARISON:  None. FINDINGS: Urinary Tract:  Negative. Bowel:  Imaged bowel loops and the appendix appear normal.  Vascular/Lymphatic: No pathologically enlarged lymph nodes. No significant vascular abnormality seen. Reproductive:  Negative. Other:  No intra-abdominal fluid collection. Musculoskeletal: Multifocal muscular edema is identified. Involved muscles include the right gluteus maximus and medius, left gluteus medius and minimus and left obturator externus. Left hip joint effusion is identified. Subcutaneous stranding is seen about the right buttock and left greater trochanter. No bony destructive change or periosteal reaction is identified. No avascular necrosis of the femoral heads. Small synovial herniation pit about both hips are noted. IMPRESSION: Multifocal muscle edema about the pelvis most most worrisome for infectious myositis in this patient with an elevated white blood cell count. Rhabdomyolysis is possible. Left hip joint effusion worrisome for septic joint given elevated white count. No evidence of osteomyelitis. Subcutaneous stranding about the left hip and right buttock likely due to cellulitis. Electronically Signed   By: Inge Rise M.D.   On: 09/26/2016 11:17   Dg Chest Port 1 View  Result Date: 09/26/2016 CLINICAL DATA:  Pain. EXAM: PORTABLE CHEST 1 VIEW COMPARISON:  None. FINDINGS: Normal heart size and mediastinal contours. No acute infiltrate or edema. No effusion or pneumothorax. No acute osseous findings. IMPRESSION: Negative portable chest. Electronically Signed   By: Monte Fantasia M.D.   On: 09/26/2016 09:25  Review of Systems  Unable to perform ROS: Other   Blood pressure 136/88, pulse 92, temperature 98.5 F (36.9 C), temperature source Oral, resp. rate 19, height _0  (1.854 m), weight 180 lb (81.6 kg), SpO2 99 %. Physical Exam  Constitutional: He appears well-developed.  HENT:  Head: Normocephalic.  Eyes: Conjunctivae are normal.  Neck: Normal range of motion.  Cardiovascular: Normal rate.   Respiratory: Effort normal.  Neurological: He is alert.  Skin: Skin is  warm.   bilateral upper chemise exam demonstrates palpable radial pulse intact sensation to both hands symmetric grip strength no coarseness or grinding or pain or swelling with range of motion of the shoulder elbow or wrist  Bilateral lower chemise examination demonstrates no knee effusion or ankle effusion.  Patient has good quad and hamstring strength bilaterally but no ankle dorsiflexion or plantarflexion strength on the right.  Ankle dorsiflexion plantar flexion strength on the left is intact.  He does not report any paresthesias on the right foot and no paresthesias on the left foot.  Pedal pulses palpable.  Muscle tone otherwise normal in the quad and hamstring region.  No muscle atrophy in the lower extremities is noted  Assessment/Plan: Impression is elevated white count with left hip effusion.  His hip is very non-irritable on examination today.  Nonetheless I think aspiration of the hip is indicated to rule out infectious process.  MRI scanning could be performed for better characterization of the soft tissue abnormalities around the hip.  This would be optimally done with contrast.  Anderson Malta 09/26/2016, 6:40 PM

## 2016-09-26 NOTE — ED Notes (Signed)
Pt refusing blood draw at this time.

## 2016-09-26 NOTE — ED Notes (Signed)
Lab attempted blood work x4 unsuccessfully. Pt now stating that he wants to leave. MD at bedside with pt and family

## 2016-09-26 NOTE — H&P (Addendum)
History and Physical  Phillip Mcdowell ZOX:[id number] DOB: November 14, 1980 DOA: 09/26/2016  PCP: Patient, No Pcp Per  Patient coming from: home  Chief Complaint: back pain  HPI:  36 year old male with history of polysubstance abuse, presented to the emergency department with greater than 24 hour history of inability to walk, low back pain, leg numbness and leg pain. Initial evaluation revealed multiple abnormalities including abnormal appearance of the brain on CT concerning for meningitis or drug use, possible pancreatitis, left hip effusion, myositis of the pelvic muscles, elevated transaminases, elevated troponin, leukocytosis. EDP discussed with radiology and neuro hospitalists at [facility], recommendations were to transfer to [facility] for further evaluation including specialist consultation, MRI of the brain.  Patient is awake, alert and answers simple questions however he has no recollection of the details leading up to his hospitalization. He does not recall falling. He endorses drug use "pills" of unknown type, marijuana. Denies IV drug use recently. No crack or cocaine. Pain complaint is low back pain, leg pain and bilateral leg numbness. He has difficulty moving his legs which he thinks is secondary to his low back pain rather than leg weakness. He does not know when the symptoms began. No specific aggravating or alleviating factors. The pain is intense and prevents him from rolling in bed or sitting up. He's had no fever, infectious symptoms and he has felt well lately. He does report upper epigastric abdominal pain.  Further history from and at bedside with whom he lives and mother by telephone. Aunt reports she found him on the floor of his room the morning of 8/4, at that time he was unable to move his legs. She called for the patient's cousin who assisted the patient back into the bed. At that time the patient was unable to lift his legs and swelling amount of the bed. The patient was given a  jug to urinate and and some liquids to drink. He was unable to leave his bed from that point. And reports he does continue to drug use although unknown kind and he does drink several beers a day. Also reported as the patient fell within the last week.  ED Course: Afebrile, vital signs stable, no hypotension, no hypoxia. Mild tachycardia. EDP discussed the patient with radiologist as well as neuro hospitalists, recommendation to proceed with MRI will hold off on lumbar puncture until MR of the brain further characterize the abnormality. Antibiotics were initiated, ceftriaxone, acyclovir, vancomycin and patient was given Decadron as well as IV fluids and a dose of Ativan.  Review of Systems:  Negative for fever, visual changes, sore throat, rash, chest pain, SOB, dysuria, bleeding, n/v  Past Medical History:  Diagnosis Date  . Bipolar 1 disorder (HCC)   . Panic attack   . Schizophrenia [facility])     Past Surgical History:  Procedure Laterality Date  . CHOLECYSTECTOMY       reports that he has been smoking Cigarettes.  He has been smoking about 0.50 packs per day. He has never used smokeless tobacco. He reports that he drinks alcohol. He reports that he uses drugs, including Marijuana. Mobility: Ambulatory  Allergies  Allergen Reactions  . Penicillins Swelling    Has patient had a PCN reaction causing immediate rash, facial/tongue/throat swelling, SOB or lightheadedness with hypotension: Yes Has patient had a PCN reaction causing severe rash involving mucus membranes or skin necrosis: No Has patient had a PCN reaction that required hospitalization: No Has patient had a PCN reaction occurring within the  last 10 years: Yes If all of the above answers are "NO", then may proceed with Cephalosporin use.   . Sulfa Antibiotics Other (See Comments)    CHILDHOOD REACTION    Family History  Problem Relation Age of Onset  . Brain cancer Paternal Grandfather      Prior to Admission medications    Medication Sig Start Date End Date Taking? Authorizing Provider  ALPRAZolam Prudy Feeler(XANAX) 0.5 MG tablet Take 0.5-1 mg by mouth 4 (four) times daily as needed for anxiety.   Yes [provider]    Physical Exam: 97.7, afebrile, 16, 100, and 135/84, 100% on room air  Constitutional. Appears calm, comfortable until he moves. Appears ill but not toxic.  Psychiatric. Alert, oriented to self, [place], year. Disoriented to month, hospital. Was able to spontaneously recognize that I was doctor.  Eyes. Pupils, irises, lids appear unremarkable.  ENT. Grossly normal hearing, lips, tongue.  Neck. No lymphadenopathy or masses. No thyromegaly.  Cardiovascular. Regular rate and rhythm. No murmur, rub or gallop. No lower extremity edema. Perfusion both feet appears intact.  Respiratory. Clear to auscultation bilaterally. No wheezes, rales or rhonchi. Normal respiratory effort.  Abdomen. Soft, nondistended. Mild epigastric pain. No hepatomegaly noted. No hernias noted.  Skin. Extensive tattooing of the upper body. There are a few erythematous lesions on the dorsum of the right foot and a few of the toes. Perfusion appears intact. There is some erythema of the right buttock, the pattern suggests pressure injury with imprinting from some type of material. Because of significant pain, he was unable to fully expose his back or sacrum. There is small ecchymosis over the left hip.  Musculoskeletal. Moves upper extremities with only mild difficulty. Strength and tone appear intact. Able to move both legs, (right. Able to bend the left knee approximately 70, able to lift the left foot off the bed barely. Reports pain with palpation over the left hip. He is able to bend the right knee approximately 45. Has difficulty but almost able to lift leg off the bed. Tone of both lower legs appears grossly normal. On moving both his legs, he grimaces in pain and reports it's painful in his back.  Neurologic.  Cranial nerves are intact. Sensation in the legs is grossly intact. Patellar reflexes are 2+ bilaterally.    Wt Readings from Last 3 Encounters:  09/26/16 81.6 kg (180 lb)  05/01/15 81.6 kg (180 lb)  09/07/14 83.9 kg (185 lb)    I have personally reviewed following labs and imaging studies  Labs:   Sodium 128, creatinine within normal limits.  Lipase 210, AST 719, ALT 231, total bilirubin 1.6.  Troponin 0.58  WBC 18.5, hemoglobin 18.1, platelets 185. Left shift on differential.  Urinalysis negative. Urine drug screen noted below.  Imaging studies:   CT head with abnormal appearance of the temporal horns  CT neck no fracture or subluxation  CT thoracic and lumbar spine, no evidence of injury. Possible pancreatitis.  CT pelvis, multifocal muscle edema, rhabdomyolysis versus infection. Left hip joint effusion. Subcutaneous stranding about the left hip and right buttock.  Chest x-ray and apparently reviewed, no acute disease.  Medical tests:   EKG sinus rhythm, T-wave inversion V1-V3. No previous available for comparison.  Test discussed with performing physician:    Decision to obtain old records:     Review and summation of old records:     Principal Problem:   Bilateral leg pain Active Problems:   Back pain   Leg numbness  Effusion of hip joint, left   Myositis   Abnormal finding on MRI of brain   Pancreatitis   Dehydration   Hyponatremia   Elevated transaminase level   Elevated troponin   Polysubstance abuse   Assessment/Plan 36 year old man with ongoing polysubstance abuse who was found on the floor of his room in the morning of 8/4, unknown how long on floor (last seen normal 8/1 or 8/2), was unable to walk or significantly move his legs, who was assisted to bed and remained in bed until the morning of 8/5. Presents with pain in his legs and low back limiting movement. Multilevel abnormalities found on CT imaging including abnormality of the  brain concerning for meningitis versus drug use, myositis of the pelvic muscles, possible pancreatitis and multiple laboratory abnormalities.  Low back pain, bilateral leg numbness, bilateral leg pain, possible weakness, status post fall at home -Etiology unclear. CT thoracic and lumbar spine without evidence of injury. Able to move both legs with intact patellar reflexes. Suspect limited movement of the legs is related to pain rather than weakness. May be secondary simply to fall. -neurology consultation  Left hip joint effusion, multifocal muscle edema in the pelvis worrisome for infectious myositis -Discussed with Dr. August Saucer, orthopedics, he will see in consultation, recommended antibiotics and aspiration of the hip joint per IR.  -empiric antibiotics -Discussed with Dr. Lowella Dandy, IR, no anticipated difficulty aspirating left hip, will evaluate at [facility]  Abnormal appearance of the temporal horns bilaterally, differential includes meningitis, drug use. -Given history and exam, meningitis seems less likely . Will continue antibiotics for now pending culture data, further imaging, neurology recommendations. -Tolerated Rocephin. Mother does not think the patient has an allergy to penicillin. -discussed with Dr. Laurence Slate. He will see in consultation. Recommended MRI brain with and without contrast--depending on findings, he will proceed with LP if indicated.  Leukocytosis, lactic acid within normal limits. Urinalysis negative. Chest x-ray no acute disease. Erythema right buttock appears to be pressure injury rather than infection. Elevated hemoglobin suggests concentration and this may be a stress reaction. No focal signs of infection although cannot discount abnormalities found on CT as characterized above. -Empiric antibiotics, CBC in a.m.  Possible pancreatitis with elevated lipase and equivocal findings on CT imaging. -Nothing by mouth, repeat lipase in the morning.  Dehydration with hyponatremia and  preserved renal function. Secondary to very limited oral intake. -IVF, repeat CMP in a.m.  Elevated transaminases, 4-15x normal, modestly elevated total bilirubin. Multiple tattoos. -Check right upper quadrant ultrasound, hepatitis panel  Elevated troponin -No cardiac symptoms, EKG with some T-wave inversion but no evidence of STEMI. Suspect secondary to strain, fall, acute illness. Doubt ACS. -Trend troponin. Repeat EKG in a.m. Echo in AM.  Polysubstance abuse. Endorses unknown pill use, marijuana, daily alcohol. Urine drug screen positive for amphetamines, benzodiazepines, opiates. Denies IV drug use although he has used in the past. No cocaine or crack. -CIWA, screen HIV, check hepatitis panel -Follow clinically.  Severity of Illness: The appropriate patient status for this patient is INPATIENT. Inpatient status is judged to be reasonable and necessary in order to provide the required intensity of service to ensure the patient's safety. The patient's presenting symptoms, physical exam findings, and initial radiographic and laboratory data in the context of their chronic comorbidities is felt to place them at high risk for further clinical deterioration. Furthermore, it is not anticipated that the patient will be medically stable for discharge from the hospital within 2 midnights of admission. The following factors  support the patient status of inpatient.   * I certify that at the point of admission it is my clinical judgment that the patient will require inpatient hospital care spanning beyond 2 midnights from the point of admission due to high intensity of service, high risk for further deterioration and high frequency of surveillance required.*     DVT prophylaxis: SCDs Code Status: full Family Communication: aunt and mother Consults called: orthopedics, IR, neurology    Time spent: 120 minutes Time spent for prolonged services [phone], included consultation with multiple  specialists and formulation of care.   Brendia Sacksaniel Goodrich, MD  [facility] Direct contact: [phone] --Via amion app OR  --[email or web address]; password [email or web address]  7PM-7AM contact night coverage as above  09/26/2016, 2:15 PM

## 2016-09-26 NOTE — ED Notes (Signed)
CRITICAL VALUE ALERT  Critical Value: Trop: 0.58  Date & Time Notied:  09/26/2016 10:13am  Provider Notified: Dr. Rosalia Hammersay

## 2016-09-26 NOTE — Procedures (Signed)
Fluoroscopic guided left hip aspiration performed.  Aspirated 2 ml of yellow joint fluid.  Will send for labs.  No immediate complication.  Minimal bleeding.

## 2016-09-26 NOTE — ED Notes (Signed)
RT at bedside for arterial stick.  

## 2016-09-26 NOTE — ED Notes (Signed)
Aunt at bedside, states she went into pt's bedroom early yesterday morning and pt was on the floor with his head lay on "some tools" she believes the pt fell. She helped the pt get into bed. Denies seeing any vomiting but reports the pt "hasn't remembered anything since". Reports noticing the pt's urine was very dark, pt denies this.

## 2016-09-26 NOTE — ED Triage Notes (Addendum)
Pt brought in by [facility] with c/o lower back pain with no radiation x "couple of days". Pt has hx chronic back pain. EMS reports that pt's aunt said pt fell yesterday causing increase in pain. When pt is asked about the fall, pt states, "I'm really not sure".

## 2016-09-26 NOTE — ED Notes (Signed)
Lab at bedside

## 2016-09-27 ENCOUNTER — Inpatient Hospital Stay (HOSPITAL_COMMUNITY): Payer: Self-pay

## 2016-09-27 ENCOUNTER — Inpatient Hospital Stay (HOSPITAL_COMMUNITY): Payer: Medicaid Other

## 2016-09-27 DIAGNOSIS — G9340 Encephalopathy, unspecified: Secondary | ICD-10-CM

## 2016-09-27 DIAGNOSIS — R930 Abnormal findings on diagnostic imaging of skull and head, not elsewhere classified: Secondary | ICD-10-CM

## 2016-09-27 DIAGNOSIS — R531 Weakness: Secondary | ICD-10-CM

## 2016-09-27 LAB — COMPREHENSIVE METABOLIC PANEL
ALK PHOS: 87 U/L (ref 38–126)
ALT: 236 U/L — ABNORMAL HIGH (ref 17–63)
ANION GAP: 11 (ref 5–15)
AST: 645 U/L — ABNORMAL HIGH (ref 15–41)
Albumin: 3.4 g/dL — ABNORMAL LOW (ref 3.5–5.0)
BILIRUBIN TOTAL: 1 mg/dL (ref 0.3–1.2)
BUN: 11 mg/dL (ref 6–20)
CALCIUM: 9.2 mg/dL (ref 8.9–10.3)
CO2: 23 mmol/L (ref 22–32)
Chloride: 100 mmol/L — ABNORMAL LOW (ref 101–111)
Creatinine, Ser: 1.15 mg/dL (ref 0.61–1.24)
Glucose, Bld: 132 mg/dL — ABNORMAL HIGH (ref 65–99)
POTASSIUM: 4.9 mmol/L (ref 3.5–5.1)
Sodium: 134 mmol/L — ABNORMAL LOW (ref 135–145)
TOTAL PROTEIN: 7.2 g/dL (ref 6.5–8.1)

## 2016-09-27 LAB — CBC WITH DIFFERENTIAL/PLATELET
Basophils Absolute: 0 10*3/uL (ref 0.0–0.1)
Basophils Relative: 0 %
EOS PCT: 0 %
Eosinophils Absolute: 0 10*3/uL (ref 0.0–0.7)
HCT: 49.9 % (ref 39.0–52.0)
Hemoglobin: 18.2 g/dL — ABNORMAL HIGH (ref 13.0–17.0)
LYMPHS ABS: 1.3 10*3/uL (ref 0.7–4.0)
LYMPHS PCT: 6 %
MCH: 33.9 pg (ref 26.0–34.0)
MCHC: 36.5 g/dL — ABNORMAL HIGH (ref 30.0–36.0)
MCV: 92.9 fL (ref 78.0–100.0)
MONO ABS: 2.1 10*3/uL — AB (ref 0.1–1.0)
MONOS PCT: 9 %
Neutro Abs: 20.8 10*3/uL — ABNORMAL HIGH (ref 1.7–7.7)
Neutrophils Relative %: 85 %
PLATELETS: 327 10*3/uL (ref 150–400)
RBC: 5.37 MIL/uL (ref 4.22–5.81)
RDW: 13.6 % (ref 11.5–15.5)
WBC: 24.3 10*3/uL — ABNORMAL HIGH (ref 4.0–10.5)

## 2016-09-27 LAB — CK: Total CK: 20874 U/L — ABNORMAL HIGH (ref 49–397)

## 2016-09-27 LAB — LIPASE, BLOOD: LIPASE: 53 U/L — AB (ref 11–51)

## 2016-09-27 LAB — HIV ANTIBODY (ROUTINE TESTING W REFLEX): HIV Screen 4th Generation wRfx: NONREACTIVE

## 2016-09-27 MED ORDER — IOPAMIDOL (ISOVUE-370) INJECTION 76%
INTRAVENOUS | Status: AC
  Administered 2016-09-27: 50 mL
  Filled 2016-09-27: qty 50

## 2016-09-27 NOTE — Progress Notes (Signed)
Pt refused MRI. Explained to the pt the procedure and why is it need to be done and agreed to do it in the AM. Pt said he is tired and wants to get some sleep. Nursing will continue to monitor.

## 2016-09-27 NOTE — Progress Notes (Signed)
MRI results noted, I think we can hold of on LP for now given lack of infection indicators other than WBC and MRI with other explanation for mental status. Will send fentanyl metabolites, but based on the duration of his symptoms, I think it is unlikely to be positive.   Ritta SlotMcNeill Daniyla Pfahler, MD [facility] [phone]  If 7pm- 7am, please page neurology on call as listed in AMION.

## 2016-09-27 NOTE — Progress Notes (Signed)
Pt unavailable for his EEG. Pt is at MRI. Will attempt at a later time when schedule permits

## 2016-09-27 NOTE — Procedures (Signed)
ELECTROENCEPHALOGRAM REPORT  Date of Study: 09/27/2016  Patient's Name: Phillip Mcdowell MRN: [id number] Date of Birth: January 15, 1981  Referring Provider: Dr. Noel Christmasharles Stewart  Clinical History: This is a 36 year old man with altered mental status.   Medications: acetaminophen (TYLENOL) tablet 650 mg  cefTRIAXone (ROCEPHIN) 2 g in dextrose 5 % 50 mL IVPB  folic acid (FOLVITE) tablet 1 mg  LORazepam (ATIVAN) injection 1 mg  LORazepam (ATIVAN) tablet 1 mg  multivitamin with minerals tablet 1 tablet  thiamine (B-1) injection 100 mg  thiamine (VITAMIN B-1) tablet 100 mg  vancomycin (VANCOCIN) IVPB 1000 mg/200 mL premix   Technical Summary: A multichannel digital EEG recording measured by the international 10-20 system with electrodes applied with paste and impedances below 5000 ohms performed as portable with EKG monitoring in a predominantly drowsy and asleep patient.  Hyperventilation and photic stimulation were not performed.  The digital EEG was referentially recorded, reformatted, and digitally filtered in a variety of bipolar and referential montages for optimal display.   Description: The patient is predominantly drowsy and asleep during the recording.  During brief period of wakefulness, there is a poorly sustained symmetric, medium voltage 8.5 Hz posterior dominant rhythm that poorly attenuates with eye opening and eye closure. This is admixed with a small amount of diffuse 4-5 Hz theta slowing of the waking background.  During drowsiness and sleep, there is an increase in theta slowing of the background.  Vertex waves and symmetric sleep spindles were seen.  Hyperventilation and photic stimulation were not performed.  There were no epileptiform discharges or electrographic seizures seen.    EKG lead was unremarkable.  Impression: This predominantly drowsy and asleep EEG is abnormal due to mild diffuse slowing of the background.  Clinical Correlation of the above findings indicates  diffuse cerebral dysfunction that is non-specific in etiology and can be seen with hypoxic/ischemic injury, toxic/metabolic encephalopathies, medication effect, or due to excessive drowsiness.  The absence of epileptiform discharges does not rule out a clinical diagnosis of epilepsy.  Clinical correlation is advised.   Patrcia DollyKaren Avarae Zwart, M.D.

## 2016-09-27 NOTE — Progress Notes (Signed)
[facility] PROGRESS NOTE  Phillip Mcdowell ZOX:[id number] DOB: May 18, 1980 DOA: 09/26/2016   PCP: Patient, No Pcp Per     Assessment/Plan: Principal Problem:   Bilateral leg pain Active Problems:   Back pain   Leg numbness   Effusion of hip joint, left   Myositis   Abnormal finding on MRI of brain   Pancreatitis   Dehydration   Hyponatremia   Elevated transaminase level   Elevated troponin   Polysubstance abuse   Abnormal head CT  36 year old male with history of polysubstance abuse, presented to the emergency department with greater than 24 hour history of inability to walk, low back pain, leg numbness and leg pain. Initial evaluation revealed multiple abnormalities including abnormal appearance of the brain on CT concerning for meningitis  , possible pancreatitis, left hip effusion, myositis of the pelvic muscles, elevated transaminases, elevated troponin, leukocytosis. EDP discussed with radiology and neuro hospitalists at [facility], recommendations were to transfer to [facility] for further evaluation including specialist consultation, MRI of the brain.   Multilevel abnormalities found on CT imaging including abnormality of the brain concerning for meningitis versus drug use, myositis of the pelvic muscles, possible pancreatitis and multiple laboratory abnormalities  Assessment and plan  Low back pain, bilateral leg numbness, bilateral leg pain, possible weakness, status post fall at home -Etiology unclear. CT thoracic and lumbar spine without evidence of injury. Able to move both legs with intact patellar reflexes.  Will include MRI of the lumbar spine the thoracic spine and the cervical spine  Left hip joint effusion, multifocal muscle edema in the pelvis worrisome for infectious myositis -Discussed with Dr. August Saucer, orthopedics, ,he recommended antibiotics and aspiration of the hip joint per IR. Labs of left hip aspirate negative for infection thus far.  White count  2000.  No organisms on Gram stain.  Dr August Saucer does not  see a surgical problem in the left hip at this time -empiric antibiotics -Discussed with Dr. Lowella Dandy, IR, no anticipated difficulty aspirating left hip, will evaluate at [facility]   Possible meningitis  bilaterally, differential includes meningitis, drug use. -Given history and exam, meningitis seems less likely . Will continue antibiotics for now pending culture data, further imaging, neurology recommendations. Continue empiric coverage for meningitis MRI of the brain with and without contrast EEG to assess for encephalopathy HIV antibody  May need LP  Leukocytosis, lactic acid within normal limits. Urinalysis negative. Chest x-ray no acute disease. Erythema right buttock appears to be pressure injury rather than infection. Elevated hemoglobin suggests concentration and this may be a stress reaction vs due to meningitis/myositis. -Empiric antibiotics, CBC in a.m.  Possible pancreatitis with elevated lipase and equivocal findings on CT imaging. -Nothing by mouth, repeat lipase in the morning.  Dehydration with hyponatremia and preserved renal function. Secondary to very limited oral intake. -IVF, repeat CMP in a.m.  Elevated transaminases, 4-15x normal, modestly elevated total bilirubin. Multiple tattoos. -Check right upper quadrant ultrasound, hepatitis panel pending   Elevated troponin -No cardiac symptoms, EKG with some T-wave inversion but no evidence of STEMI. Suspect secondary to strain, fall, acute illness. Doubt ACS. -Trend troponin. Repeat EKG in a.m. Echo in AM.  Polysubstance abuse. Endorses unknown pill use, marijuana, daily alcohol. Urine drug screen positive for amphetamines, benzodiazepines, opiates. Denies IV drug use although he has used in the past. No cocaine or crack. -CIWA, screen HIV, check hepatitis panel -Follow clinically.    DVT prophylaxsis   Code Status:  Full  code  Family Communication: Discussed in  detail with the patient, all imaging results, lab results explained to the patient   Disposition Plan:   Pending improvement    Consultants:  Neurology  Radiology    Procedures:  Aspiration of the left hip by IR on 8/6  Antibiotics: Anti-infectives    Start     Dose/Rate Route Frequency Ordered Stop   09/27/16 0000  cefTRIAXone (ROCEPHIN) 2 g in dextrose 5 % 50 mL IVPB     2 g 100 mL/hr over 30 Minutes Intravenous Every 12 hours 09/26/16 1716     09/26/16 2000  vancomycin (VANCOCIN) IVPB 1000 mg/200 mL premix     1,000 mg 200 mL/hr over 60 Minutes Intravenous Every 8 hours 09/26/16 1724     09/26/16 1130  cefTRIAXone (ROCEPHIN) 2 g in dextrose 5 % 50 mL IVPB     2 g 100 mL/hr over 30 Minutes Intravenous  Once 09/26/16 1117 09/26/16 1157   09/26/16 1130  vancomycin (VANCOCIN) IVPB 1000 mg/200 mL premix     1,000 mg 200 mL/hr over 60 Minutes Intravenous  Once 09/26/16 1117 09/26/16 1234   09/26/16 1130  acyclovir (ZOVIRAX) 815 mg in dextrose 5 % 150 mL IVPB     10 mg/kg  81.6 kg 166.3 mL/hr over 60 Minutes Intravenous  Once 09/26/16 1119 09/26/16 1355         HPI/Subjective: Still has generalized pain   Objective: Vitals:   09/26/16 1500 09/26/16 1600 09/26/16 1751 09/26/16 2109  BP: 122/80 116/73 136/88 135/88  Pulse: (!) 109 (!) 107 92 (!) 102  Resp: (!) 22 (!) 22 19 18   Temp: 98 F (36.7 C) 98.1 F (36.7 C) 98.5 F (36.9 C) 98 F (36.7 C)  TempSrc: Oral Oral Oral Oral  SpO2: 98% 99% 99% 100%  Weight:      Height:        Intake/Output Summary (Last 24 hours) at 09/27/16 1153 Last data filed at 09/27/16 0443  Gross per 24 hour  Intake                0 ml  Output              900 ml  Net             -900 ml    Exam:  Examination:  General exam: Appears calm and comfortable  Respiratory system: Clear to auscultation. Respiratory effort normal. Cardiovascular system: S1 & S2 heard, RRR. No JVD, murmurs, rubs, gallops or clicks. No pedal  edema. Gastrointestinal system: Abdomen is nondistended, soft and nontender. No organomegaly or masses felt. Normal bowel sounds heard. Central nervous system: Alert and oriented. No focal neurological deficits. Extremities: Symmetric 5 x 5 power. Skin: No rashes, lesions or ulcers Psychiatry: Judgement and insight appear normal. Mood & affect appropriate.     Data Reviewed: I have personally reviewed following labs and imaging studies  Micro Results Recent Results (from the past 240 hour(s))  Blood culture (routine x 2)     Status: None (Preliminary result)   Collection Time: 09/26/16  9:25 AM  Result Value Ref Range Status   Specimen Description   Final    BLOOD BLOOD RIGHT WRIST BOTTLES DRAWN AEROBIC AND ANAEROBIC   Special Requests   Final    Blood Culture results may not be optimal due to an inadequate volume of blood received in culture bottles   Culture NO GROWTH < 24 HOURS  Final  Report Status PENDING  Incomplete  Anaerobic culture     Status: None (Preliminary result)   Collection Time: 09/26/16  7:15 PM  Result Value Ref Range Status   Specimen Description SYNOVIAL FLUID LEFT HIP  Final   Special Requests NONE  Final   Culture NO GROWTH < 12 HOURS  Final   Report Status PENDING  Incomplete  Body fluid culture     Status: None (Preliminary result)   Collection Time: 09/26/16  7:15 PM  Result Value Ref Range Status   Specimen Description SYNOVIAL FLUID LEFT HIP  Final   Special Requests NONE  Final   Gram Stain   Final    MODERATE WBC PRESENT, PREDOMINANTLY PMN NO ORGANISMS SEEN    Culture NO GROWTH < 12 HOURS  Final   Report Status PENDING  Incomplete    Radiology Reports Ct Head Wo Contrast  Result Date: 09/26/2016 CLINICAL DATA:  Numbness in legs.  Found down. EXAM: CT HEAD WITHOUT CONTRAST CT CERVICAL SPINE WITHOUT CONTRAST TECHNIQUE: Multidetector CT imaging of the head and cervical spine was performed following the standard protocol without intravenous  contrast. Multiplanar CT image reconstructions of the cervical spine were also generated. COMPARISON:  None. FINDINGS: CT HEAD FINDINGS Brain: Ventricles are normal in size and configuration. However, temporal horns of the lateral ventricles have an abnormal appearance bilaterally, perhaps indicating adjacent hippocampal edema and/or debris related to underlying meningitis. Remainder of the brain demonstrate normal gray-white matter attenuation. No parenchymal or extra-axial hemorrhage. Vascular: No hyperdense vessel or unexpected calcification. Skull: Normal. Negative for fracture or focal lesion. Sinuses/Orbits: No acute finding. Other: None. CT CERVICAL SPINE FINDINGS Alignment: Dextroscoliosis. No evidence of acute vertebral body subluxation. Skull base and vertebrae: No fracture line or displaced fracture fragment identified. Facet joints appear intact and normal in alignment. Soft tissues and spinal canal: No prevertebral fluid or swelling. No visible canal hematoma. Disc levels: Disc spaces are well preserved throughout. No significant central canal stenosis at any level. Upper chest: Mild emphysematous change at the lung apices. No acute findings. Other: None. IMPRESSION: 1. Temporal horns of the lateral ventricles have an abnormal appearance bilaterally, perhaps indicating adjacent hippocampal edema and/or debris related to underlying meningitis. This appearance can also be related to drug use. Recommend brain MRI with contrast for further characterization. 2. No fracture or acute subluxation within the cervical spine. Scoliosis. 3. Emphysema. These results were called by telephone at the time of interpretation on 09/26/2016 at 11:11 am to Dr. Margarita Grizzle , who verbally acknowledged these results. Electronically Signed   By: Bary Richard M.D.   On: 09/26/2016 11:12   Ct Cervical Spine Wo Contrast  Result Date: 09/26/2016 CLINICAL DATA:  Numbness in legs.  Found down. EXAM: CT HEAD WITHOUT CONTRAST CT  CERVICAL SPINE WITHOUT CONTRAST TECHNIQUE: Multidetector CT imaging of the head and cervical spine was performed following the standard protocol without intravenous contrast. Multiplanar CT image reconstructions of the cervical spine were also generated. COMPARISON:  None. FINDINGS: CT HEAD FINDINGS Brain: Ventricles are normal in size and configuration. However, temporal horns of the lateral ventricles have an abnormal appearance bilaterally, perhaps indicating adjacent hippocampal edema and/or debris related to underlying meningitis. Remainder of the brain demonstrate normal gray-white matter attenuation. No parenchymal or extra-axial hemorrhage. Vascular: No hyperdense vessel or unexpected calcification. Skull: Normal. Negative for fracture or focal lesion. Sinuses/Orbits: No acute finding. Other: None. CT CERVICAL SPINE FINDINGS Alignment: Dextroscoliosis. No evidence of acute vertebral body subluxation. Skull base and  vertebrae: No fracture line or displaced fracture fragment identified. Facet joints appear intact and normal in alignment. Soft tissues and spinal canal: No prevertebral fluid or swelling. No visible canal hematoma. Disc levels: Disc spaces are well preserved throughout. No significant central canal stenosis at any level. Upper chest: Mild emphysematous change at the lung apices. No acute findings. Other: None. IMPRESSION: 1. Temporal horns of the lateral ventricles have an abnormal appearance bilaterally, perhaps indicating adjacent hippocampal edema and/or debris related to underlying meningitis. This appearance can also be related to drug use. Recommend brain MRI with contrast for further characterization. 2. No fracture or acute subluxation within the cervical spine. Scoliosis. 3. Emphysema. These results were called by telephone at the time of interpretation on 09/26/2016 at 11:11 am to Dr. Margarita Grizzle , who verbally acknowledged these results. Electronically Signed   By: Bary Richard M.D.    On: 09/26/2016 11:12   Ct Thoracic Spine Wo Contrast  Result Date: 09/26/2016 CLINICAL DATA:  Suspected fall yesterday.  Limited history. EXAM: CT THORACIC AND LUMBAR SPINE WITHOUT CONTRAST TECHNIQUE: Multidetector CT imaging of the thoracic and lumbar spine was performed without contrast. Multiplanar CT image reconstructions were also generated. COMPARISON:  None. FINDINGS: CT THORACIC SPINE FINDINGS Alignment: Mild exaggerated kyphosis.  No traumatic malalignment. Vertebrae: Negative for fracture or bone lesion. Paraspinal and other soft tissues: No acute finding. 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 Disc levels: No evidence of degenerative impingement. Ligamentum flavum thickening in ossification at T4-5 and T5-6. CT LUMBAR SPINE FINDINGS Segmentation: 5 lumbar type vertebrae. Alignment: Normal. Vertebrae: No acute fracture or focal pathologic process. Paraspinal and other soft tissues: Mild retroperitoneal edema centered in the central and left upper quadrant. The pancreatic tail appears somewhat thickened and indistinct diffusely. Disc levels: No significant degenerative changes or evidence of impingement. IMPRESSION: CT THORACIC SPINE IMPRESSION No evidence of injury. CT LUMBAR SPINE IMPRESSION 1. No evidence of lumbar spine injury. 2. Mild retroperitoneal edema and pancreatic tail thickening, please correlate for pancreatitis by exam and labs. Electronically Signed   By: Marnee Spring M.D.   On: 09/26/2016 10:58   Ct Lumbar Spine Wo Contrast  Result Date: 09/26/2016 CLINICAL DATA:  Suspected fall yesterday.  Limited history. EXAM: CT THORACIC AND LUMBAR SPINE WITHOUT CONTRAST TECHNIQUE: Multidetector CT imaging of the thoracic and lumbar spine was performed without contrast. Multiplanar CT image reconstructions were also generated. COMPARISON:  None. FINDINGS: CT THORACIC SPINE FINDINGS Alignment: Mild exaggerated kyphosis.  No traumatic malalignment. Vertebrae: Negative for fracture or bone lesion.  Paraspinal and other soft tissues: No acute finding. 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 Disc levels: No evidence of degenerative impingement. Ligamentum flavum thickening in ossification at T4-5 and T5-6. CT LUMBAR SPINE FINDINGS Segmentation: 5 lumbar type vertebrae. Alignment: Normal. Vertebrae: No acute fracture or focal pathologic process. Paraspinal and other soft tissues: Mild retroperitoneal edema centered in the central and left upper quadrant. The pancreatic tail appears somewhat thickened and indistinct diffusely. Disc levels: No significant degenerative changes or evidence of impingement. IMPRESSION: CT THORACIC SPINE IMPRESSION No evidence of injury. CT LUMBAR SPINE IMPRESSION 1. No evidence of lumbar spine injury. 2. Mild retroperitoneal edema and pancreatic tail thickening, please correlate for pancreatitis by exam and labs. Electronically Signed   By: Marnee Spring M.D.   On: 09/26/2016 10:58   Ct Pelvis Wo Contrast  Result Date: 09/26/2016 CLINICAL DATA:  Bilateral lower extremity numbness. The patient is unable to walk. Possible fall yesterday. Elevated white blood cell count.  EXAM: CT PELVIS WITHOUT CONTRAST TECHNIQUE: Multidetector CT imaging of the pelvis was performed following the standard protocol without intravenous contrast. COMPARISON:  None. FINDINGS: Urinary Tract:  Negative. Bowel:  Imaged bowel loops and the appendix appear normal. Vascular/Lymphatic: No pathologically enlarged lymph nodes. No significant vascular abnormality seen. Reproductive:  Negative. Other:  No intra-abdominal fluid collection. Musculoskeletal: Multifocal muscular edema is identified. Involved muscles include the right gluteus maximus and medius, left gluteus medius and minimus and left obturator externus. Left hip joint effusion is identified. Subcutaneous stranding is seen about the right buttock and left greater trochanter. No bony destructive change or periosteal reaction is identified. No avascular  necrosis of the femoral heads. Small synovial herniation pit about both hips are noted. IMPRESSION: Multifocal muscle edema about the pelvis most most worrisome for infectious myositis in this patient with an elevated white blood cell count. Rhabdomyolysis is possible. Left hip joint effusion worrisome for septic joint given elevated white count. No evidence of osteomyelitis. Subcutaneous stranding about the left hip and right buttock likely due to cellulitis. Electronically Signed   By: Drusilla Kanner M.D.   On: 09/26/2016 11:17   Dg Chest Port 1 View  Result Date: 09/26/2016 CLINICAL DATA:  Pain. EXAM: PORTABLE CHEST 1 VIEW COMPARISON:  None. FINDINGS: Normal heart size and mediastinal contours. No acute infiltrate or edema. No effusion or pneumothorax. No acute osseous findings. IMPRESSION: Negative portable chest. Electronically Signed   By: Marnee Spring M.D.   On: 09/26/2016 09:25   US Abdomen Limited Ruq  Result Date: 09/26/2016 CLINICAL DATA:  Initial evaluation for elevated transaminitis. EXAM: ULTRASOUND ABDOMEN LIMITED RIGHT UPPER QUADRANT COMPARISON:  None available. FINDINGS: Gallbladder: Evaluation of the gallbladder somewhat limited due to overlying bowel gas and patient positioning. No definite echogenic stones or sludge identified. Gallbladder wall measure within normal limits a 2.3 mm. No free pericholecystic fluid. No sonographic Murphy sign elicited on exam. Common bile duct: Diameter: 2.9 mm Liver: No focal lesion identified. Within normal limits in parenchymal echogenicity. IMPRESSION: Grossly normal right upper quadrant ultrasound without cholelithiasis, evidence for acute cholecystitis, or biliary dilatation. Please note that evaluation of the gallbladder mildly limited due to shadowing from overlying bowel gas and patient positioning. Electronically Signed   By: Rise Mu M.D.   On: 09/26/2016 21:43     CBC  Recent Labs Lab 09/26/16 0924 09/27/16 0511  WBC 18.5*  24.3*  HGB 18.1* 18.2*  HCT 50.5 49.9  PLT 185 327  MCV 94.7 92.9  MCH 34.0 33.9  MCHC 35.8 36.5*  RDW 13.6 13.6  LYMPHSABS 1.3 1.3  MONOABS 1.5* 2.1*  EOSABS 0.2 0.0  BASOSABS 0.0 0.0    Chemistries   Recent Labs Lab 09/26/16 0924 09/27/16 0511  NA 128* 134*  K 4.0 4.9  CL 99* 100*  CO2 21* 23  GLUCOSE 149* 132*  BUN 12 11  CREATININE 0.97 1.15  CALCIUM 8.6* 9.2  AST 719* 645*  ALT 231* 236*  ALKPHOS 93 87  BILITOT 1.6* 1.0   ------------------------------------------------------------------------------------------------------------------ estimated creatinine clearance is 102.3 mL/min (by C-G formula based on SCr of 1.15 mg/dL). ------------------------------------------------------------------------------------------------------------------ No results for input(s): HGBA1C in the last 72 hours. ------------------------------------------------------------------------------------------------------------------ No results for input(s): CHOL, HDL, LDLCALC, TRIG, CHOLHDL, LDLDIRECT in the last 72 hours. ------------------------------------------------------------------------------------------------------------------ No results for input(s): TSH, T4TOTAL, T3FREE, THYROIDAB in the last 72 hours.  Invalid input(s): FREET3 ------------------------------------------------------------------------------------------------------------------ No results for input(s): VITAMINB12, FOLATE, FERRITIN, TIBC, IRON, RETICCTPCT in the last 72 hours.  Coagulation profile  No results for input(s): INR, PROTIME in the last 168 hours.  No results for input(s): DDIMER in the last 72 hours.  Cardiac Enzymes  Recent Labs Lab 09/26/16 0924 09/26/16 1545  TROPONINI 0.58* 0.57*   ------------------------------------------------------------------------------------------------------------------ Invalid input(s): POCBNP   CBG: No results for input(s): GLUCAP in the last 168  hours.     Studies: Ct Head Wo Contrast  Result Date: 09/26/2016 CLINICAL DATA:  Numbness in legs.  Found down. EXAM: CT HEAD WITHOUT CONTRAST CT CERVICAL SPINE WITHOUT CONTRAST TECHNIQUE: Multidetector CT imaging of the head and cervical spine was performed following the standard protocol without intravenous contrast. Multiplanar CT image reconstructions of the cervical spine were also generated. COMPARISON:  None. FINDINGS: CT HEAD FINDINGS Brain: Ventricles are normal in size and configuration. However, temporal horns of the lateral ventricles have an abnormal appearance bilaterally, perhaps indicating adjacent hippocampal edema and/or debris related to underlying meningitis. Remainder of the brain demonstrate normal gray-white matter attenuation. No parenchymal or extra-axial hemorrhage. Vascular: No hyperdense vessel or unexpected calcification. Skull: Normal. Negative for fracture or focal lesion. Sinuses/Orbits: No acute finding. Other: None. CT CERVICAL SPINE FINDINGS Alignment: Dextroscoliosis. No evidence of acute vertebral body subluxation. Skull base and vertebrae: No fracture line or displaced fracture fragment identified. Facet joints appear intact and normal in alignment. Soft tissues and spinal canal: No prevertebral fluid or swelling. No visible canal hematoma. Disc levels: Disc spaces are well preserved throughout. No significant central canal stenosis at any level. Upper chest: Mild emphysematous change at the lung apices. No acute findings. Other: None. IMPRESSION: 1. Temporal horns of the lateral ventricles have an abnormal appearance bilaterally, perhaps indicating adjacent hippocampal edema and/or debris related to underlying meningitis. This appearance can also be related to drug use. Recommend brain MRI with contrast for further characterization. 2. No fracture or acute subluxation within the cervical spine. Scoliosis. 3. Emphysema. These results were called by telephone at the time  of interpretation on 09/26/2016 at 11:11 am to Dr. Margarita Grizzle , who verbally acknowledged these results. Electronically Signed   By: Bary Richard M.D.   On: 09/26/2016 11:12   Ct Cervical Spine Wo Contrast  Result Date: 09/26/2016 CLINICAL DATA:  Numbness in legs.  Found down. EXAM: CT HEAD WITHOUT CONTRAST CT CERVICAL SPINE WITHOUT CONTRAST TECHNIQUE: Multidetector CT imaging of the head and cervical spine was performed following the standard protocol without intravenous contrast. Multiplanar CT image reconstructions of the cervical spine were also generated. COMPARISON:  None. FINDINGS: CT HEAD FINDINGS Brain: Ventricles are normal in size and configuration. However, temporal horns of the lateral ventricles have an abnormal appearance bilaterally, perhaps indicating adjacent hippocampal edema and/or debris related to underlying meningitis. Remainder of the brain demonstrate normal gray-white matter attenuation. No parenchymal or extra-axial hemorrhage. Vascular: No hyperdense vessel or unexpected calcification. Skull: Normal. Negative for fracture or focal lesion. Sinuses/Orbits: No acute finding. Other: None. CT CERVICAL SPINE FINDINGS Alignment: Dextroscoliosis. No evidence of acute vertebral body subluxation. Skull base and vertebrae: No fracture line or displaced fracture fragment identified. Facet joints appear intact and normal in alignment. Soft tissues and spinal canal: No prevertebral fluid or swelling. No visible canal hematoma. Disc levels: Disc spaces are well preserved throughout. No significant central canal stenosis at any level. Upper chest: Mild emphysematous change at the lung apices. No acute findings. Other: None. IMPRESSION: 1. Temporal horns of the lateral ventricles have an abnormal appearance bilaterally, perhaps indicating adjacent hippocampal edema and/or debris related to underlying meningitis. This appearance  can also be related to drug use. Recommend brain MRI with contrast for  further characterization. 2. No fracture or acute subluxation within the cervical spine. Scoliosis. 3. Emphysema. These results were called by telephone at the time of interpretation on 09/26/2016 at 11:11 am to Dr. Margarita Grizzle , who verbally acknowledged these results. Electronically Signed   By: Bary Richard M.D.   On: 09/26/2016 11:12   Ct Thoracic Spine Wo Contrast  Result Date: 09/26/2016 CLINICAL DATA:  Suspected fall yesterday.  Limited history. EXAM: CT THORACIC AND LUMBAR SPINE WITHOUT CONTRAST TECHNIQUE: Multidetector CT imaging of the thoracic and lumbar spine was performed without contrast. Multiplanar CT image reconstructions were also generated. COMPARISON:  None. FINDINGS: CT THORACIC SPINE FINDINGS Alignment: Mild exaggerated kyphosis.  No traumatic malalignment. Vertebrae: Negative for fracture or bone lesion. Paraspinal and other soft tissues: No acute finding. 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 Disc levels: No evidence of degenerative impingement. Ligamentum flavum thickening in ossification at T4-5 and T5-6. CT LUMBAR SPINE FINDINGS Segmentation: 5 lumbar type vertebrae. Alignment: Normal. Vertebrae: No acute fracture or focal pathologic process. Paraspinal and other soft tissues: Mild retroperitoneal edema centered in the central and left upper quadrant. The pancreatic tail appears somewhat thickened and indistinct diffusely. Disc levels: No significant degenerative changes or evidence of impingement. IMPRESSION: CT THORACIC SPINE IMPRESSION No evidence of injury. CT LUMBAR SPINE IMPRESSION 1. No evidence of lumbar spine injury. 2. Mild retroperitoneal edema and pancreatic tail thickening, please correlate for pancreatitis by exam and labs. Electronically Signed   By: Marnee Spring M.D.   On: 09/26/2016 10:58   Ct Lumbar Spine Wo Contrast  Result Date: 09/26/2016 CLINICAL DATA:  Suspected fall yesterday.  Limited history. EXAM: CT THORACIC AND LUMBAR SPINE WITHOUT CONTRAST TECHNIQUE:  Multidetector CT imaging of the thoracic and lumbar spine was performed without contrast. Multiplanar CT image reconstructions were also generated. COMPARISON:  None. FINDINGS: CT THORACIC SPINE FINDINGS Alignment: Mild exaggerated kyphosis.  No traumatic malalignment. Vertebrae: Negative for fracture or bone lesion. Paraspinal and other soft tissues: No acute finding. 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 Disc levels: No evidence of degenerative impingement. Ligamentum flavum thickening in ossification at T4-5 and T5-6. CT LUMBAR SPINE FINDINGS Segmentation: 5 lumbar type vertebrae. Alignment: Normal. Vertebrae: No acute fracture or focal pathologic process. Paraspinal and other soft tissues: Mild retroperitoneal edema centered in the central and left upper quadrant. The pancreatic tail appears somewhat thickened and indistinct diffusely. Disc levels: No significant degenerative changes or evidence of impingement. IMPRESSION: CT THORACIC SPINE IMPRESSION No evidence of injury. CT LUMBAR SPINE IMPRESSION 1. No evidence of lumbar spine injury. 2. Mild retroperitoneal edema and pancreatic tail thickening, please correlate for pancreatitis by exam and labs. Electronically Signed   By: Marnee Spring M.D.   On: 09/26/2016 10:58   Ct Pelvis Wo Contrast  Result Date: 09/26/2016 CLINICAL DATA:  Bilateral lower extremity numbness. The patient is unable to walk. Possible fall yesterday. Elevated white blood cell count. EXAM: CT PELVIS WITHOUT CONTRAST TECHNIQUE: Multidetector CT imaging of the pelvis was performed following the standard protocol without intravenous contrast. COMPARISON:  None. FINDINGS: Urinary Tract:  Negative. Bowel:  Imaged bowel loops and the appendix appear normal. Vascular/Lymphatic: No pathologically enlarged lymph nodes. No significant vascular abnormality seen. Reproductive:  Negative. Other:  No intra-abdominal fluid collection. Musculoskeletal: Multifocal muscular edema is identified.  Involved muscles include the right gluteus maximus and medius, left gluteus medius and minimus and left obturator externus. Left hip joint  effusion is identified. Subcutaneous stranding is seen about the right buttock and left greater trochanter. No bony destructive change or periosteal reaction is identified. No avascular necrosis of the femoral heads. Small synovial herniation pit about both hips are noted. IMPRESSION: Multifocal muscle edema about the pelvis most most worrisome for infectious myositis in this patient with an elevated white blood cell count. Rhabdomyolysis is possible. Left hip joint effusion worrisome for septic joint given elevated white count. No evidence of osteomyelitis. Subcutaneous stranding about the left hip and right buttock likely due to cellulitis. Electronically Signed   By: Drusilla Kannerhomas  Dalessio M.D.   On: 09/26/2016 11:17   Dg Chest Port 1 View  Result Date: 09/26/2016 CLINICAL DATA:  Pain. EXAM: PORTABLE CHEST 1 VIEW COMPARISON:  None. FINDINGS: Normal heart size and mediastinal contours. No acute infiltrate or edema. No effusion or pneumothorax. No acute osseous findings. IMPRESSION: Negative portable chest. Electronically Signed   By: Marnee SpringJonathon  Watts M.D.   On: 09/26/2016 09:25   [place] Abdomen Limited Ruq  Result Date: 09/26/2016 CLINICAL DATA:  Initial evaluation for elevated transaminitis. EXAM: ULTRASOUND ABDOMEN LIMITED RIGHT UPPER QUADRANT COMPARISON:  None available. FINDINGS: Gallbladder: Evaluation of the gallbladder somewhat limited due to overlying bowel gas and patient positioning. No definite echogenic stones or sludge identified. Gallbladder wall measure within normal limits a 2.3 mm. No free pericholecystic fluid. No sonographic Murphy sign elicited on exam. Common bile duct: Diameter: 2.9 mm Liver: No focal lesion identified. Within normal limits in parenchymal echogenicity. IMPRESSION: Grossly normal right upper quadrant ultrasound without cholelithiasis, evidence  for acute cholecystitis, or biliary dilatation. Please note that evaluation of the gallbladder mildly limited due to shadowing from overlying bowel gas and patient positioning. Electronically Signed   By: Rise MuBenjamin  McClintock M.D.   On: 09/26/2016 21:43      No results found for: HGBA1C Lab Results  Component Value Date   CREATININE 1.15 09/27/2016       Scheduled Meds: . folic acid  1 mg Oral Daily  . multivitamin with minerals  1 tablet Oral Daily  . thiamine  100 mg Oral Daily   Or  . thiamine  100 mg Intravenous Daily   Continuous Infusions: . sodium chloride 1,000 mL (09/26/16 1524)  . cefTRIAXone (ROCEPHIN)  IV Stopped (09/27/16 0030)  . vancomycin 1,000 mg (09/27/16 1149)     LOS: 1 day    Time spent: >30 MINS    Phillip OverlieNayana Dasani Mcdowell  [facility] Pager [phone]. If 7PM-7AM, please contact night-coverage at [email or web address], password [email or web address] 09/27/2016, 11:53 AM  LOS: 1 day

## 2016-09-27 NOTE — Progress Notes (Signed)
Subjective: Patient refused MRI overnight due to wanting to sleep  Exam: Vitals:   09/26/16 1751 09/26/16 2109  BP: 136/88 135/88  Pulse: 92 (!) 102  Resp: 19 18  Temp: 98.5 F (36.9 C) 98 F (36.7 C)   Gen: In bed, NAD Resp: non-labored breathing, no acute distress Abd: soft, nt  Neuro: MS: Awake, oriented to hospital but not month CN: Pupils large but round and reactive to light, visual fields full, extraocular movements intact, face symmetric Motor: He moves all extremities to command, but has bilateral leg weakness Sensory: Endorses symmetric sensation to light touch DTR: 3+ pectoral, biceps, patella with crossed adductors bilaterally, left lower has nonsustained clonus at the ankle  Pertinent Labs: Elevated CK  Impression: 36 year old male with confusion, gait dysfunction, hyperreflexia. I am concerned about possible spinal cord pathology given the bilateral nature of his weakness, and would favor MRI including the spine as well.  Recommendations: 1) MRI brain with/without contrast 2) MRI cervical/thoracic spine 3) EEG 4) lumbar puncture, would favor MRI first if possible, will attempt for this today.   Ritta SlotMcNeill Alisson Rozell, MD [facility] [phone]  If 7pm- 7am, please page neurology on call as listed in AMION.

## 2016-09-27 NOTE — Progress Notes (Signed)
Routine EEG completed, results pending. 

## 2016-09-27 NOTE — Progress Notes (Signed)
Labs of left hip aspirate negative for infection thus far.  White count 2000.  No organisms on Gram stain.  Do not see a surgical problem in the left hip at this time.  Please reconsult if further issues arise

## 2016-09-28 ENCOUNTER — Inpatient Hospital Stay (HOSPITAL_COMMUNITY): Payer: Medicaid Other

## 2016-09-28 ENCOUNTER — Encounter (HOSPITAL_COMMUNITY): Payer: Self-pay | Admitting: General Practice

## 2016-09-28 DIAGNOSIS — R9431 Abnormal electrocardiogram [ECG] [EKG]: Secondary | ICD-10-CM

## 2016-09-28 DIAGNOSIS — G9340 Encephalopathy, unspecified: Secondary | ICD-10-CM

## 2016-09-28 LAB — ECHOCARDIOGRAM COMPLETE
Ao-asc: 23 cm
Area-P 1/2: 4.15 cm2
EERAT: 5.93
EWDT: 180 ms
FS: 35 % (ref 28–44)
HEIGHTINCHES: 73 in
IVS/LV PW RATIO, ED: 0.75
LA ID, A-P, ES: 29 mm
LA vol A4C: 36.4 ml
LA vol index: 18.6 mL/m2
LADIAMINDEX: 1.41 cm/m2
LAVOL: 38.2 mL
LEFT ATRIUM END SYS DIAM: 29 mm
LV E/e' medial: 5.93
LV e' LATERAL: 16.2 cm/s
LVEEAVG: 5.93
LVOT area: 3.46 cm2
LVOT diameter: 21 mm
Lateral S' vel: 19.8 cm/s
MV Dec: 180
MV Peak grad: 4 mmHg
MVPKAVEL: 64 m/s
MVPKEVEL: 96 m/s
MVSPHT: 53 ms
PW: 13.6 mm — AB (ref 0.6–1.1)
RV TAPSE: 26.5 mm
TDI e' lateral: 16.2
TDI e' medial: 11.5
WEIGHTICAEL: 2880 [oz_av]

## 2016-09-28 LAB — HEPATITIS PANEL, ACUTE
HEP A IGM: NEGATIVE
HEP B C IGM: NEGATIVE
HEP B S AG: NEGATIVE

## 2016-09-28 LAB — CK: Total CK: 10961 U/L — ABNORMAL HIGH (ref 49–397)

## 2016-09-28 LAB — LIPASE, BLOOD: Lipase: 58 U/L — ABNORMAL HIGH (ref 11–51)

## 2016-09-28 LAB — COMPREHENSIVE METABOLIC PANEL
ALBUMIN: 2.8 g/dL — AB (ref 3.5–5.0)
ALT: 152 U/L — ABNORMAL HIGH (ref 17–63)
AST: 360 U/L — AB (ref 15–41)
Alkaline Phosphatase: 55 U/L (ref 38–126)
Anion gap: 8 (ref 5–15)
BILIRUBIN TOTAL: 0.9 mg/dL (ref 0.3–1.2)
BUN: 15 mg/dL (ref 6–20)
CHLORIDE: 103 mmol/L (ref 101–111)
CO2: 23 mmol/L (ref 22–32)
Calcium: 8.5 mg/dL — ABNORMAL LOW (ref 8.9–10.3)
Creatinine, Ser: 1 mg/dL (ref 0.61–1.24)
GFR calc Af Amer: >60 mL/min (ref >60)
GFR calc non Af Amer: >60 mL/min (ref >60)
GLUCOSE: 99 mg/dL (ref 65–99)
POTASSIUM: 3.9 mmol/L (ref 3.5–5.1)
Sodium: 134 mmol/L — ABNORMAL LOW (ref 135–145)
Total Protein: 5.4 g/dL — ABNORMAL LOW (ref 6.5–8.1)

## 2016-09-28 LAB — CBC
HEMATOCRIT: 42.1 % (ref 39.0–52.0)
HEMOGLOBIN: 14.7 g/dL (ref 13.0–17.0)
MCH: 32.7 pg (ref 26.0–34.0)
MCHC: 34.9 g/dL (ref 30.0–36.0)
MCV: 93.6 fL (ref 78.0–100.0)
Platelets: 262 10*3/uL (ref 150–400)
RBC: 4.5 MIL/uL (ref 4.22–5.81)
RDW: 13.6 % (ref 11.5–15.5)
WBC: 11.2 10*3/uL — ABNORMAL HIGH (ref 4.0–10.5)

## 2016-09-28 MED ORDER — LORAZEPAM 2 MG/ML IJ SOLN
1.0000 mg | Freq: Once | INTRAMUSCULAR | Status: AC
  Administered 2016-09-28: 1 mg via INTRAVENOUS
  Filled 2016-09-28: qty 1

## 2016-09-28 NOTE — Progress Notes (Addendum)
[facility] PROGRESS NOTE  Phillip Mcdowell ZOX:[id number] DOB: May 01, 1980 DOA: 09/26/2016   PCP: Patient, No Pcp Per     Assessment/Plan: Principal Problem:   Bilateral leg pain Active Problems:   Back pain   Leg numbness   Effusion of hip joint, left   Myositis   Abnormal finding on MRI of brain   Pancreatitis   Dehydration   Hyponatremia   Elevated transaminase level   Elevated troponin   Polysubstance abuse   Abnormal head CT   Acute encephalopathy   Abnormal EKG  36 year old male with history of polysubstance abuse, presented to the emergency department with greater than 24 hour history of inability to walk, low back pain, leg numbness and leg pain. Initial evaluation revealed multiple abnormalities including abnormal appearance of the brain on CT concerning for meningitis  , possible pancreatitis, left hip effusion, myositis of the pelvic muscles, elevated transaminases, elevated troponin, leukocytosis. EDP discussed with radiology and  neurology at [facility], recommendations were to transfer to [facility] for further evaluation including specialist consultation, MRI of the brain.   Multilevel abnormalities found on CT imaging including abnormality of the brain concerning for meningitis versus drug use, myositis of the pelvic muscles, possible pancreatitis and multiple laboratory abnormalities  Assessment and plan Low back pain, bilateral leg numbness, bilateral leg pain, possible weakness, status post fall at home, could be secondary to rhabdomyolysis CK initially greater than 36K, now 20 K and trending down. Given gait dysfunction, hyperreflexia, concern for spinal cord pathology, CT thoracic and lumbar spine without evidence of injury. Neurology has seen the patient and has recommended MRI  brain, cervical, thoracic and lumbar spine with and without contrast.. No indication for LP at this time, low suspicion for meningitis  Left hip joint effusion, multifocal muscle  edema in the pelvis worrisome for infectious myositis Patient also admitted to using IV heroine prior to admission -Discussed with Dr. August Saucer, orthopedics, ,he recommended antibiotics and aspiration of the hip joint per IR. Labs of left hip aspirate negative for infection thus far.  White count 2000.  No organisms on Gram stain.  Dr August Saucer does not  see a surgical problem in the left hip at this time -empiric antibiotics -Discussed with Dr. Lowella Dandy, IR, no anticipated difficulty aspirating left hip,     Possible meningitis  bilaterally, differential includes  drug use, low suspicion for meningitis, hold off on LP for now according to Dr. Petra Kuba  Neurology has low suspicion for meningitis, will DC antibiotics, blood culture negative 2, MRI of the brain with and without contrast EEG  predominantly drowsy and asleep HIV antibody negative  Positive HCV antibody This will need outpatient ID follow-up   Leukocytosis, lactic acid within normal limits. Urinalysis negative. Chest x-ray no acute disease. Erythema right buttock appears to be pressure injury rather than infection. Elevated hemoglobin suggests concentration and this may be a stress reaction vs due to meningitis/myositis. -Continue Empiric antibiotics, DC if the patient's culture data is negative  Possible pancreatitis with elevated lipase and equivocal findings on CT imaging- .Mild retroperitoneal edema and pancreatic tail thickening Patient advance to soft diet, will monitor lipase, asymptomatic  Dehydration with hyponatremia and preserved renal function. Secondary to very limited oral intake. Continue IV hydration  Elevated transaminases, could be related to rhabdomyolysis, hepatitis C antibody Follow closely   Elevated troponin, likely secondary to elevated CK -No cardiac symptoms, EKG with some T-wave inversion but no evidence of STEMI. Suspect secondary to  strain, fall, acute illness. Doubt ACS. -Trend troponin. Repeat EKG  in a.m. Echo in AM.  Polysubstance abuse. Endorses unknown pill use, marijuana, daily alcohol. Urine drug screen positive for amphetamines, benzodiazepines, opiates. Denies IV drug use although he has used in the past. No cocaine or crack. HIV panel negative -Follow clinically.    DVT prophylaxsis Lovenox  Code Status:  Full code  Family Communication: Discussed in detail with the patient/mother , all imaging results, lab results explained to the patient   Disposition Plan:   Pending improvement    Consultants:  Neurology  Radiology      Procedures:  Aspiration of the left hip by IR on 8/6  Antibiotics: Anti-infectives    Start     Dose/Rate Route Frequency Ordered Stop   09/27/16 0000  cefTRIAXone (ROCEPHIN) 2 g in dextrose 5 % 50 mL IVPB     2 g 100 mL/hr over 30 Minutes Intravenous Every 12 hours 09/26/16 1716     09/26/16 2000  vancomycin (VANCOCIN) IVPB 1000 mg/200 mL premix     1,000 mg 200 mL/hr over 60 Minutes Intravenous Every 8 hours 09/26/16 1724     09/26/16 1130  cefTRIAXone (ROCEPHIN) 2 g in dextrose 5 % 50 mL IVPB     2 g 100 mL/hr over 30 Minutes Intravenous  Once 09/26/16 1117 09/26/16 1157   09/26/16 1130  vancomycin (VANCOCIN) IVPB 1000 mg/200 mL premix     1,000 mg 200 mL/hr over 60 Minutes Intravenous  Once 09/26/16 1117 09/26/16 1234   09/26/16 1130  acyclovir (ZOVIRAX) 815 mg in dextrose 5 % 150 mL IVPB     10 mg/kg  81.6 kg 166.3 mL/hr over 60 Minutes Intravenous  Once 09/26/16 1119 09/26/16 1355         HPI/Subjective: Still has generalized pain  But improved , mother by the bedside   Objective: Vitals:   09/26/16 2109 09/27/16 1445 09/27/16 2307 09/28/16 0800  BP: 135/88 131/80 134/79 119/67  Pulse: (!) 102 83 100 76  Resp: 18 19 18    Temp: 98 F (36.7 C) 97.8 F (36.6 C) 98.1 F (36.7 C) 98.1 F (36.7 C)  TempSrc: Oral Oral Oral Oral  SpO2: 100% 100% 100%   Weight:      Height:        Intake/Output Summary (Last  24 hours) at 09/28/16 0840 Last data filed at 09/28/16 0520  Gross per 24 hour  Intake             2250 ml  Output                0 ml  Net             2250 ml    Exam:  Examination:  General exam: Appears calm and comfortable  Respiratory system: Clear to auscultation. Respiratory effort normal. Cardiovascular system: S1 & S2 heard, RRR. No JVD, murmurs, rubs, gallops or clicks. No pedal edema. Gastrointestinal system: Abdomen is nondistended, soft and nontender. No organomegaly or masses felt. Normal bowel sounds heard. Central nervous system: Alert and oriented. No focal neurological deficits. Extremities: Symmetric 5 x 5 power. Skin: No rashes, lesions or ulcers Psychiatry: Judgement and insight appear normal. Mood & affect appropriate.     Data Reviewed: I have personally reviewed following labs and imaging studies  Micro Results Recent Results (from the past 240 hour(s))  Blood culture (routine x 2)     Status: None (Preliminary result)   Collection  Time: 09/26/16  9:25 AM  Result Value Ref Range Status   Specimen Description   Final    BLOOD BLOOD RIGHT WRIST BOTTLES DRAWN AEROBIC AND ANAEROBIC   Special Requests   Final    Blood Culture results may not be optimal due to an inadequate volume of blood received in culture bottles   Culture NO GROWTH 2 DAYS  Final   Report Status PENDING  Incomplete  Anaerobic culture     Status: None (Preliminary result)   Collection Time: 09/26/16  7:15 PM  Result Value Ref Range Status   Specimen Description SYNOVIAL FLUID LEFT HIP  Final   Special Requests NONE  Final   Culture NO GROWTH < 12 HOURS  Final   Report Status PENDING  Incomplete  Body fluid culture     Status: None (Preliminary result)   Collection Time: 09/26/16  7:15 PM  Result Value Ref Range Status   Specimen Description SYNOVIAL FLUID LEFT HIP  Final   Special Requests NONE  Final   Gram Stain   Final    MODERATE WBC PRESENT, PREDOMINANTLY PMN NO ORGANISMS  SEEN    Culture NO GROWTH < 12 HOURS  Final   Report Status PENDING  Incomplete    Radiology Reports Ct Head Wo Contrast  Result Date: 09/27/2016 CLINICAL DATA:  36 y/o  M; substance abuse and abnormal MRI. EXAM: CT HEAD WITHOUT CONTRAST TECHNIQUE: Contiguous axial images were obtained from the base of the skull through the vertex without intravenous contrast. COMPARISON:  09/27/2016 MRI head. FINDINGS: Brain: Hypoattenuation and mild mass effect of the hippocampi by corresponding to reduced diffusion on prior MRI. Punctate foci of reduced diffusion on MRI are not visible on CT. No evidence for new acute intracranial hemorrhage, infarct, focal mass effect, hydrocephalus, or extra-axial collection. Vascular: No hyperdense vessel or unexpected calcification. Skull: Normal. Negative for fracture or focal lesion. Sinuses/Orbits: No acute finding. Other: None. IMPRESSION: 1. Hypoattenuation and mild mass effect of the hippocampi corresponding to diffusion abnormality on MRI. 2. No evidence for new acute intracranial hemorrhage, acute infarct, or focal mass effect. Electronically Signed   By: Mitzi HansenLance  Furusawa-Stratton M.D.   On: 09/27/2016 21:40   Ct Head Wo Contrast  Result Date: 09/26/2016 CLINICAL DATA:  Numbness in legs.  Found down. EXAM: CT HEAD WITHOUT CONTRAST CT CERVICAL SPINE WITHOUT CONTRAST TECHNIQUE: Multidetector CT imaging of the head and cervical spine was performed following the standard protocol without intravenous contrast. Multiplanar CT image reconstructions of the cervical spine were also generated. COMPARISON:  None. FINDINGS: CT HEAD FINDINGS Brain: Ventricles are normal in size and configuration. However, temporal horns of the lateral ventricles have an abnormal appearance bilaterally, perhaps indicating adjacent hippocampal edema and/or debris related to underlying meningitis. Remainder of the brain demonstrate normal gray-white matter attenuation. No parenchymal or extra-axial  hemorrhage. Vascular: No hyperdense vessel or unexpected calcification. Skull: Normal. Negative for fracture or focal lesion. Sinuses/Orbits: No acute finding. Other: None. CT CERVICAL SPINE FINDINGS Alignment: Dextroscoliosis. No evidence of acute vertebral body subluxation. Skull base and vertebrae: No fracture line or displaced fracture fragment identified. Facet joints appear intact and normal in alignment. Soft tissues and spinal canal: No prevertebral fluid or swelling. No visible canal hematoma. Disc levels: Disc spaces are well preserved throughout. No significant central canal stenosis at any level. Upper chest: Mild emphysematous change at the lung apices. No acute findings. Other: None. IMPRESSION: 1. Temporal horns of the lateral ventricles have an abnormal appearance bilaterally, perhaps  indicating adjacent hippocampal edema and/or debris related to underlying meningitis. This appearance can also be related to drug use. Recommend brain MRI with contrast for further characterization. 2. No fracture or acute subluxation within the cervical spine. Scoliosis. 3. Emphysema. These results were called by telephone at the time of interpretation on 09/26/2016 at 11:11 am to Dr. Margarita Grizzle , who verbally acknowledged these results. Electronically Signed   By: Bary Richard M.D.   On: 09/26/2016 11:12   Ct Cervical Spine Wo Contrast  Result Date: 09/26/2016 CLINICAL DATA:  Numbness in legs.  Found down. EXAM: CT HEAD WITHOUT CONTRAST CT CERVICAL SPINE WITHOUT CONTRAST TECHNIQUE: Multidetector CT imaging of the head and cervical spine was performed following the standard protocol without intravenous contrast. Multiplanar CT image reconstructions of the cervical spine were also generated. COMPARISON:  None. FINDINGS: CT HEAD FINDINGS Brain: Ventricles are normal in size and configuration. However, temporal horns of the lateral ventricles have an abnormal appearance bilaterally, perhaps indicating adjacent  hippocampal edema and/or debris related to underlying meningitis. Remainder of the brain demonstrate normal gray-white matter attenuation. No parenchymal or extra-axial hemorrhage. Vascular: No hyperdense vessel or unexpected calcification. Skull: Normal. Negative for fracture or focal lesion. Sinuses/Orbits: No acute finding. Other: None. CT CERVICAL SPINE FINDINGS Alignment: Dextroscoliosis. No evidence of acute vertebral body subluxation. Skull base and vertebrae: No fracture line or displaced fracture fragment identified. Facet joints appear intact and normal in alignment. Soft tissues and spinal canal: No prevertebral fluid or swelling. No visible canal hematoma. Disc levels: Disc spaces are well preserved throughout. No significant central canal stenosis at any level. Upper chest: Mild emphysematous change at the lung apices. No acute findings. Other: None. IMPRESSION: 1. Temporal horns of the lateral ventricles have an abnormal appearance bilaterally, perhaps indicating adjacent hippocampal edema and/or debris related to underlying meningitis. This appearance can also be related to drug use. Recommend brain MRI with contrast for further characterization. 2. No fracture or acute subluxation within the cervical spine. Scoliosis. 3. Emphysema. These results were called by telephone at the time of interpretation on 09/26/2016 at 11:11 am to Dr. Margarita Grizzle , who verbally acknowledged these results. Electronically Signed   By: Bary Richard M.D.   On: 09/26/2016 11:12   Ct Thoracic Spine Wo Contrast  Result Date: 09/26/2016 CLINICAL DATA:  Suspected fall yesterday.  Limited history. EXAM: CT THORACIC AND LUMBAR SPINE WITHOUT CONTRAST TECHNIQUE: Multidetector CT imaging of the thoracic and lumbar spine was performed without contrast. Multiplanar CT image reconstructions were also generated. COMPARISON:  None. FINDINGS: CT THORACIC SPINE FINDINGS Alignment: Mild exaggerated kyphosis.  No traumatic malalignment.  Vertebrae: Negative for fracture or bone lesion. Paraspinal and other soft tissues: No acute finding. 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 Disc levels: No evidence of degenerative impingement. Ligamentum flavum thickening in ossification at T4-5 and T5-6. CT LUMBAR SPINE FINDINGS Segmentation: 5 lumbar type vertebrae. Alignment: Normal. Vertebrae: No acute fracture or focal pathologic process. Paraspinal and other soft tissues: Mild retroperitoneal edema centered in the central and left upper quadrant. The pancreatic tail appears somewhat thickened and indistinct diffusely. Disc levels: No significant degenerative changes or evidence of impingement. IMPRESSION: CT THORACIC SPINE IMPRESSION No evidence of injury. CT LUMBAR SPINE IMPRESSION 1. No evidence of lumbar spine injury. 2. Mild retroperitoneal edema and pancreatic tail thickening, please correlate for pancreatitis by exam and labs. Electronically Signed   By: Marnee Spring M.D.   On: 09/26/2016 10:58   Ct Lumbar Spine Wo Contrast  Result Date: 09/26/2016 CLINICAL DATA:  Suspected fall yesterday.  Limited history. EXAM: CT THORACIC AND LUMBAR SPINE WITHOUT CONTRAST TECHNIQUE: Multidetector CT imaging of the thoracic and lumbar spine was performed without contrast. Multiplanar CT image reconstructions were also generated. COMPARISON:  None. FINDINGS: CT THORACIC SPINE FINDINGS Alignment: Mild exaggerated kyphosis.  No traumatic malalignment. Vertebrae: Negative for fracture or bone lesion. Paraspinal and other soft tissues: No acute finding. 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 Disc levels: No evidence of degenerative impingement. Ligamentum flavum thickening in ossification at T4-5 and T5-6. CT LUMBAR SPINE FINDINGS Segmentation: 5 lumbar type vertebrae. Alignment: Normal. Vertebrae: No acute fracture or focal pathologic process. Paraspinal and other soft tissues: Mild retroperitoneal edema centered in the central and left upper quadrant. The  pancreatic tail appears somewhat thickened and indistinct diffusely. Disc levels: No significant degenerative changes or evidence of impingement. IMPRESSION: CT THORACIC SPINE IMPRESSION No evidence of injury. CT LUMBAR SPINE IMPRESSION 1. No evidence of lumbar spine injury. 2. Mild retroperitoneal edema and pancreatic tail thickening, please correlate for pancreatitis by exam and labs. Electronically Signed   By: Marnee Spring M.D.   On: 09/26/2016 10:58   Ct Pelvis Wo Contrast  Result Date: 09/26/2016 CLINICAL DATA:  Bilateral lower extremity numbness. The patient is unable to walk. Possible fall yesterday. Elevated white blood cell count. EXAM: CT PELVIS WITHOUT CONTRAST TECHNIQUE: Multidetector CT imaging of the pelvis was performed following the standard protocol without intravenous contrast. COMPARISON:  None. FINDINGS: Urinary Tract:  Negative. Bowel:  Imaged bowel loops and the appendix appear normal. Vascular/Lymphatic: No pathologically enlarged lymph nodes. No significant vascular abnormality seen. Reproductive:  Negative. Other:  No intra-abdominal fluid collection. Musculoskeletal: Multifocal muscular edema is identified. Involved muscles include the right gluteus maximus and medius, left gluteus medius and minimus and left obturator externus. Left hip joint effusion is identified. Subcutaneous stranding is seen about the right buttock and left greater trochanter. No bony destructive change or periosteal reaction is identified. No avascular necrosis of the femoral heads. Small synovial herniation pit about both hips are noted. IMPRESSION: Multifocal muscle edema about the pelvis most most worrisome for infectious myositis in this patient with an elevated white blood cell count. Rhabdomyolysis is possible. Left hip joint effusion worrisome for septic joint given elevated white count. No evidence of osteomyelitis. Subcutaneous stranding about the left hip and right buttock likely due to cellulitis.  Electronically Signed   By: Drusilla Kanner M.D.   On: 09/26/2016 11:17   Mr Brain Wo Contrast  Addendum Date: 09/27/2016   ADDENDUM REPORT: 09/27/2016 13:59 ADDENDUM: Acute findings discussed with and reconfirmed by Dr.Kirkpatrick, Neurology on 09/27/2016 at 1:45pm. Electronically Signed   By: Awilda Metro M.D.   On: 09/27/2016 13:59   Result Date: 09/27/2016 CLINICAL DATA:  Weakness and pain, follow-up abnormal temporal horns. Evaluate for meningitis. History of polysubstance abuse. EXAM: MRI HEAD WITHOUT CONTRAST TECHNIQUE: Multiplanar, multiecho pulse sequences of the brain and surrounding structures were obtained without intravenous contrast. COMPARISON:  CT HEAD September 26, 2016 FINDINGS: Multiple sequences are mildly or moderately motion degraded. INTRACRANIAL CONTENTS: Symmetric reduced diffusion bilateral hippocampi with low ADC values and bright FLAIR T2 hyperintense signal. Subcentimeter foci of reduced diffusion bilateral basal ganglia, LEFT cerebellum. No intraventricular reduced diffusion. No abnormal susceptibility artifact to suggest hemorrhage. No midline shift, mass effect or masses. No abnormal extra-axial fluid collections. VASCULAR: Normal major intracranial vascular flow voids present at skull base. SKULL AND UPPER CERVICAL SPINE: No abnormal sellar expansion. No  suspicious calvarial bone marrow signal. Craniocervical junction maintained. SINUSES/ORBITS: The mastoid air-cells and included paranasal sinuses are well-aerated.The included ocular globes and orbital contents are non-suspicious. OTHER: None. IMPRESSION: 1. 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 Findings less likely reflect herpes or limbic encephalitis. Additional satellite lacunar infarcts. 2. No imaging findings of meningitis by noncontrast MRI. Electronically Signed: By: Awilda Metro M.D. On: 09/27/2016 13:51   Dg Chest Port 1 View  Result Date: 09/26/2016 CLINICAL DATA:   Pain. EXAM: PORTABLE CHEST 1 VIEW COMPARISON:  None. FINDINGS: Normal heart size and mediastinal contours. No acute infiltrate or edema. No effusion or pneumothorax. No acute osseous findings. IMPRESSION: Negative portable chest. Electronically Signed   By: Marnee Spring M.D.   On: 09/26/2016 09:25   Dg Fluoro Guided Needle Plc Aspiration/injection Loc  Result Date: 09/28/2016 CLINICAL DATA:  Bilateral lower extremity numbness. Left hip fluid noted on recent imaging. History of polysubstance abuse. EXAM: LEFT HIP ASPIRATION UNDER FLUOROSCOPY FLUOROSCOPY TIME:  Fluoroscopy Time:  30 seconds Number of Acquired Spot Images: 3 PROCEDURE: Informed consent was obtained from the patient's parents. The patient's mental status was altered and he was unable to give consent. Left common femoral vein was palpated. Left femoral head and neck region was identified with fluoroscopy. Skin was prepped with Betadine. Sterile field was created. Skin was anesthetized with 1% lidocaine. 20 gauge spinal needle was directed onto the lateral aspect of the left femoral head and neck junction with fluoroscopic guidance. 2 mL of yellow joint fluid was aspirated. Needle was removed without complication. Bandage placed over the puncture site. IMPRESSION: Technically successful left hip aspiration with fluoroscopy. Fluid was sent for analysis. Electronically Signed   By: Richarda Overlie M.D.   On: 09/28/2016 07:49   US Abdomen Limited Ruq  Result Date: 09/26/2016 CLINICAL DATA:  Initial evaluation for elevated transaminitis. EXAM: ULTRASOUND ABDOMEN LIMITED RIGHT UPPER QUADRANT COMPARISON:  None available. FINDINGS: Gallbladder: Evaluation of the gallbladder somewhat limited due to overlying bowel gas and patient positioning. No definite echogenic stones or sludge identified. Gallbladder wall measure within normal limits a 2.3 mm. No free pericholecystic fluid. No sonographic Murphy sign elicited on exam. Common bile duct: Diameter: 2.9 mm  Liver: No focal lesion identified. Within normal limits in parenchymal echogenicity. IMPRESSION: Grossly normal right upper quadrant ultrasound without cholelithiasis, evidence for acute cholecystitis, or biliary dilatation. Please note that evaluation of the gallbladder mildly limited due to shadowing from overlying bowel gas and patient positioning. Electronically Signed   By: Rise Mu M.D.   On: 09/26/2016 21:43     CBC  Recent Labs Lab 09/26/16 0924 09/27/16 0511  WBC 18.5* 24.3*  HGB 18.1* 18.2*  HCT 50.5 49.9  PLT 185 327  MCV 94.7 92.9  MCH 34.0 33.9  MCHC 35.8 36.5*  RDW 13.6 13.6  LYMPHSABS 1.3 1.3  MONOABS 1.5* 2.1*  EOSABS 0.2 0.0  BASOSABS 0.0 0.0    Chemistries   Recent Labs Lab 09/26/16 0924 09/27/16 0511  NA 128* 134*  K 4.0 4.9  CL 99* 100*  CO2 21* 23  GLUCOSE 149* 132*  BUN 12 11  CREATININE 0.97 1.15  CALCIUM 8.6* 9.2  AST 719* 645*  ALT 231* 236*  ALKPHOS 93 87  BILITOT 1.6* 1.0   ------------------------------------------------------------------------------------------------------------------ estimated creatinine clearance is 102.3 mL/min (by C-G formula based on SCr of 1.15 mg/dL). ------------------------------------------------------------------------------------------------------------------ No results for input(s): HGBA1C in the last 72 hours. ------------------------------------------------------------------------------------------------------------------ No results for input(s): CHOL, HDL,  LDLCALC, TRIG, CHOLHDL, LDLDIRECT in the last 72 hours. ------------------------------------------------------------------------------------------------------------------ No results for input(s): TSH, T4TOTAL, T3FREE, THYROIDAB in the last 72 hours.  Invalid input(s): FREET3 ------------------------------------------------------------------------------------------------------------------ No results for input(s): VITAMINB12, FOLATE,  FERRITIN, TIBC, IRON, RETICCTPCT in the last 72 hours.  Coagulation profile No results for input(s): INR, PROTIME in the last 168 hours.  No results for input(s): DDIMER in the last 72 hours.  Cardiac Enzymes  Recent Labs Lab 09/26/16 0924 09/26/16 1545  TROPONINI 0.58* 0.57*   ------------------------------------------------------------------------------------------------------------------ Invalid input(s): POCBNP   CBG: No results for input(s): GLUCAP in the last 168 hours.     Studies: Ct Head Wo Contrast  Result Date: 09/27/2016 CLINICAL DATA:  36 y/o  M; substance abuse and abnormal MRI. EXAM: CT HEAD WITHOUT CONTRAST TECHNIQUE: Contiguous axial images were obtained from the base of the skull through the vertex without intravenous contrast. COMPARISON:  09/27/2016 MRI head. FINDINGS: Brain: Hypoattenuation and mild mass effect of the hippocampi by corresponding to reduced diffusion on prior MRI. Punctate foci of reduced diffusion on MRI are not visible on CT. No evidence for new acute intracranial hemorrhage, infarct, focal mass effect, hydrocephalus, or extra-axial collection. Vascular: No hyperdense vessel or unexpected calcification. Skull: Normal. Negative for fracture or focal lesion. Sinuses/Orbits: No acute finding. Other: None. IMPRESSION: 1. Hypoattenuation and mild mass effect of the hippocampi corresponding to diffusion abnormality on MRI. 2. No evidence for new acute intracranial hemorrhage, acute infarct, or focal mass effect. Electronically Signed   By: Mitzi Hansen M.D.   On: 09/27/2016 21:40   Ct Head Wo Contrast  Result Date: 09/26/2016 CLINICAL DATA:  Numbness in legs.  Found down. EXAM: CT HEAD WITHOUT CONTRAST CT CERVICAL SPINE WITHOUT CONTRAST TECHNIQUE: Multidetector CT imaging of the head and cervical spine was performed following the standard protocol without intravenous contrast. Multiplanar CT image reconstructions of the cervical spine were  also generated. COMPARISON:  None. FINDINGS: CT HEAD FINDINGS Brain: Ventricles are normal in size and configuration. However, temporal horns of the lateral ventricles have an abnormal appearance bilaterally, perhaps indicating adjacent hippocampal edema and/or debris related to underlying meningitis. Remainder of the brain demonstrate normal gray-white matter attenuation. No parenchymal or extra-axial hemorrhage. Vascular: No hyperdense vessel or unexpected calcification. Skull: Normal. Negative for fracture or focal lesion. Sinuses/Orbits: No acute finding. Other: None. CT CERVICAL SPINE FINDINGS Alignment: Dextroscoliosis. No evidence of acute vertebral body subluxation. Skull base and vertebrae: No fracture line or displaced fracture fragment identified. Facet joints appear intact and normal in alignment. Soft tissues and spinal canal: No prevertebral fluid or swelling. No visible canal hematoma. Disc levels: Disc spaces are well preserved throughout. No significant central canal stenosis at any level. Upper chest: Mild emphysematous change at the lung apices. No acute findings. Other: None. IMPRESSION: 1. Temporal horns of the lateral ventricles have an abnormal appearance bilaterally, perhaps indicating adjacent hippocampal edema and/or debris related to underlying meningitis. This appearance can also be related to drug use. Recommend brain MRI with contrast for further characterization. 2. No fracture or acute subluxation within the cervical spine. Scoliosis. 3. Emphysema. These results were called by telephone at the time of interpretation on 09/26/2016 at 11:11 am to Dr. Margarita Grizzle , who verbally acknowledged these results. Electronically Signed   By: Bary Richard M.D.   On: 09/26/2016 11:12   Ct Cervical Spine Wo Contrast  Result Date: 09/26/2016 CLINICAL DATA:  Numbness in legs.  Found down. EXAM: CT HEAD WITHOUT CONTRAST CT CERVICAL SPINE WITHOUT CONTRAST  TECHNIQUE: Multidetector CT imaging of the  head and cervical spine was performed following the standard protocol without intravenous contrast. Multiplanar CT image reconstructions of the cervical spine were also generated. COMPARISON:  None. FINDINGS: CT HEAD FINDINGS Brain: Ventricles are normal in size and configuration. However, temporal horns of the lateral ventricles have an abnormal appearance bilaterally, perhaps indicating adjacent hippocampal edema and/or debris related to underlying meningitis. Remainder of the brain demonstrate normal gray-white matter attenuation. No parenchymal or extra-axial hemorrhage. Vascular: No hyperdense vessel or unexpected calcification. Skull: Normal. Negative for fracture or focal lesion. Sinuses/Orbits: No acute finding. Other: None. CT CERVICAL SPINE FINDINGS Alignment: Dextroscoliosis. No evidence of acute vertebral body subluxation. Skull base and vertebrae: No fracture line or displaced fracture fragment identified. Facet joints appear intact and normal in alignment. Soft tissues and spinal canal: No prevertebral fluid or swelling. No visible canal hematoma. Disc levels: Disc spaces are well preserved throughout. No significant central canal stenosis at any level. Upper chest: Mild emphysematous change at the lung apices. No acute findings. Other: None. IMPRESSION: 1. Temporal horns of the lateral ventricles have an abnormal appearance bilaterally, perhaps indicating adjacent hippocampal edema and/or debris related to underlying meningitis. This appearance can also be related to drug use. Recommend brain MRI with contrast for further characterization. 2. No fracture or acute subluxation within the cervical spine. Scoliosis. 3. Emphysema. These results were called by telephone at the time of interpretation on 09/26/2016 at 11:11 am to Dr. Margarita Grizzle , who verbally acknowledged these results. Electronically Signed   By: Bary Richard M.D.   On: 09/26/2016 11:12   Ct Thoracic Spine Wo Contrast  Result Date:  09/26/2016 CLINICAL DATA:  Suspected fall yesterday.  Limited history. EXAM: CT THORACIC AND LUMBAR SPINE WITHOUT CONTRAST TECHNIQUE: Multidetector CT imaging of the thoracic and lumbar spine was performed without contrast. Multiplanar CT image reconstructions were also generated. COMPARISON:  None. FINDINGS: CT THORACIC SPINE FINDINGS Alignment: Mild exaggerated kyphosis.  No traumatic malalignment. Vertebrae: Negative for fracture or bone lesion. Paraspinal and other soft tissues: No acute finding. 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 Disc levels: No evidence of degenerative impingement. Ligamentum flavum thickening in ossification at T4-5 and T5-6. CT LUMBAR SPINE FINDINGS Segmentation: 5 lumbar type vertebrae. Alignment: Normal. Vertebrae: No acute fracture or focal pathologic process. Paraspinal and other soft tissues: Mild retroperitoneal edema centered in the central and left upper quadrant. The pancreatic tail appears somewhat thickened and indistinct diffusely. Disc levels: No significant degenerative changes or evidence of impingement. IMPRESSION: CT THORACIC SPINE IMPRESSION No evidence of injury. CT LUMBAR SPINE IMPRESSION 1. No evidence of lumbar spine injury. 2. Mild retroperitoneal edema and pancreatic tail thickening, please correlate for pancreatitis by exam and labs. Electronically Signed   By: Marnee Spring M.D.   On: 09/26/2016 10:58   Ct Lumbar Spine Wo Contrast  Result Date: 09/26/2016 CLINICAL DATA:  Suspected fall yesterday.  Limited history. EXAM: CT THORACIC AND LUMBAR SPINE WITHOUT CONTRAST TECHNIQUE: Multidetector CT imaging of the thoracic and lumbar spine was performed without contrast. Multiplanar CT image reconstructions were also generated. COMPARISON:  None. FINDINGS: CT THORACIC SPINE FINDINGS Alignment: Mild exaggerated kyphosis.  No traumatic malalignment. Vertebrae: Negative for fracture or bone lesion. Paraspinal and other soft tissues: No acute finding. Rare  emphysematous spaces at the apex. Disc levels: No evidence of degenerative impingement. Ligamentum flavum thickening in ossification at T4-5 and T5-6. CT LUMBAR SPINE FINDINGS Segmentation: 5 lumbar type vertebrae. Alignment: Normal. Vertebrae: No acute fracture  or focal pathologic process. Paraspinal and other soft tissues: Mild retroperitoneal edema centered in the central and left upper quadrant. The pancreatic tail appears somewhat thickened and indistinct diffusely. Disc levels: No significant degenerative changes or evidence of impingement. IMPRESSION: CT THORACIC SPINE IMPRESSION No evidence of injury. CT LUMBAR SPINE IMPRESSION 1. No evidence of lumbar spine injury. 2. Mild retroperitoneal edema and pancreatic tail thickening, please correlate for pancreatitis by exam and labs. Electronically Signed   By: Marnee Spring M.D.   On: 09/26/2016 10:58   Ct Pelvis Wo Contrast  Result Date: 09/26/2016 CLINICAL DATA:  Bilateral lower extremity numbness. The patient is unable to walk. Possible fall yesterday. Elevated white blood cell count. EXAM: CT PELVIS WITHOUT CONTRAST TECHNIQUE: Multidetector CT imaging of the pelvis was performed following the standard protocol without intravenous contrast. COMPARISON:  None. FINDINGS: Urinary Tract:  Negative. Bowel:  Imaged bowel loops and the appendix appear normal. Vascular/Lymphatic: No pathologically enlarged lymph nodes. No significant vascular abnormality seen. Reproductive:  Negative. Other:  No intra-abdominal fluid collection. Musculoskeletal: Multifocal muscular edema is identified. Involved muscles include the right gluteus maximus and medius, left gluteus medius and minimus and left obturator externus. Left hip joint effusion is identified. Subcutaneous stranding is seen about the right buttock and left greater trochanter. No bony destructive change or periosteal reaction is identified. No avascular necrosis of the femoral heads. Small synovial herniation  pit about both hips are noted. IMPRESSION: Multifocal muscle edema about the pelvis most most worrisome for infectious myositis in this patient with an elevated white blood cell count. Rhabdomyolysis is possible. Left hip joint effusion worrisome for septic joint given elevated white count. No evidence of osteomyelitis. Subcutaneous stranding about the left hip and right buttock likely due to cellulitis. Electronically Signed   By: Drusilla Kanner M.D.   On: 09/26/2016 11:17   Mr Brain Wo Contrast  Addendum Date: 09/27/2016   ADDENDUM REPORT: 09/27/2016 13:59 ADDENDUM: Acute findings discussed with and reconfirmed by Dr.Kirkpatrick, Neurology on 09/27/2016 at 1:45pm. Electronically Signed   By: Awilda Metro M.D.   On: 09/27/2016 13:59   Result Date: 09/27/2016 CLINICAL DATA:  Weakness and pain, follow-up abnormal temporal horns. Evaluate for meningitis. History of polysubstance abuse. EXAM: MRI HEAD WITHOUT CONTRAST TECHNIQUE: Multiplanar, multiecho pulse sequences of the brain and surrounding structures were obtained without intravenous contrast. COMPARISON:  CT HEAD September 26, 2016 FINDINGS: Multiple sequences are mildly or moderately motion degraded. INTRACRANIAL CONTENTS: Symmetric reduced diffusion bilateral hippocampi with low ADC values and bright FLAIR T2 hyperintense signal. Subcentimeter foci of reduced diffusion bilateral basal ganglia, LEFT cerebellum. No intraventricular reduced diffusion. No abnormal susceptibility artifact to suggest hemorrhage. No midline shift, mass effect or masses. No abnormal extra-axial fluid collections. VASCULAR: Normal major intracranial vascular flow voids present at skull base. SKULL AND UPPER CERVICAL SPINE: No abnormal sellar expansion. No suspicious calvarial bone marrow signal. Craniocervical junction maintained. SINUSES/ORBITS: The mastoid air-cells and included paranasal sinuses are well-aerated.The included ocular globes and orbital contents are  non-suspicious. OTHER: None. IMPRESSION: 1. 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 Findings less likely reflect herpes or limbic encephalitis. Additional satellite lacunar infarcts. 2. No imaging findings of meningitis by noncontrast MRI. Electronically Signed: By: Awilda Metro M.D. On: 09/27/2016 13:51   Dg Chest Port 1 View  Result Date: 09/26/2016 CLINICAL DATA:  Pain. EXAM: PORTABLE CHEST 1 VIEW COMPARISON:  None. FINDINGS: Normal heart size and mediastinal contours. No acute infiltrate or edema. No effusion or pneumothorax.  No acute osseous findings. IMPRESSION: Negative portable chest. Electronically Signed   By: Marnee Spring M.D.   On: 09/26/2016 09:25   Dg Fluoro Guided Needle Plc Aspiration/injection Loc  Result Date: 09/28/2016 CLINICAL DATA:  Bilateral lower extremity numbness. Left hip fluid noted on recent imaging. History of polysubstance abuse. EXAM: LEFT HIP ASPIRATION UNDER FLUOROSCOPY FLUOROSCOPY TIME:  Fluoroscopy Time:  30 seconds Number of Acquired Spot Images: 3 PROCEDURE: Informed consent was obtained from the patient's parents. The patient's mental status was altered and he was unable to give consent. Left common femoral vein was palpated. Left femoral head and neck region was identified with fluoroscopy. Skin was prepped with Betadine. Sterile field was created. Skin was anesthetized with 1% lidocaine. 20 gauge spinal needle was directed onto the lateral aspect of the left femoral head and neck junction with fluoroscopic guidance. 2 mL of yellow joint fluid was aspirated. Needle was removed without complication. Bandage placed over the puncture site. IMPRESSION: Technically successful left hip aspiration with fluoroscopy. Fluid was sent for analysis. Electronically Signed   By: Richarda Overlie M.D.   On: 09/28/2016 07:49   US Abdomen Limited Ruq  Result Date: 09/26/2016 CLINICAL DATA:  Initial evaluation for elevated transaminitis.  EXAM: ULTRASOUND ABDOMEN LIMITED RIGHT UPPER QUADRANT COMPARISON:  None available. FINDINGS: Gallbladder: Evaluation of the gallbladder somewhat limited due to overlying bowel gas and patient positioning. No definite echogenic stones or sludge identified. Gallbladder wall measure within normal limits a 2.3 mm. No free pericholecystic fluid. No sonographic Murphy sign elicited on exam. Common bile duct: Diameter: 2.9 mm Liver: No focal lesion identified. Within normal limits in parenchymal echogenicity. IMPRESSION: Grossly normal right upper quadrant ultrasound without cholelithiasis, evidence for acute cholecystitis, or biliary dilatation. Please note that evaluation of the gallbladder mildly limited due to shadowing from overlying bowel gas and patient positioning. Electronically Signed   By: Rise Mu M.D.   On: 09/26/2016 21:43      No results found for: HGBA1C Lab Results  Component Value Date   CREATININE 1.15 09/27/2016       Scheduled Meds: . folic acid  1 mg Oral Daily  . multivitamin with minerals  1 tablet Oral Daily  . thiamine  100 mg Oral Daily   Or  . thiamine  100 mg Intravenous Daily   Continuous Infusions: . sodium chloride 150 mL/hr at 09/28/16 0520  . cefTRIAXone (ROCEPHIN)  IV Stopped (09/28/16 0105)  . vancomycin Stopped (09/28/16 0520)     LOS: 2 days    Time spent: >30 MINS    Richarda Overlie  [facility] Pager [phone]. If 7PM-7AM, please contact night-coverage at [email or web address], password [facility] 09/28/2016, 8:40 AM  LOS: 2 days

## 2016-09-28 NOTE — Progress Notes (Signed)
  Echocardiogram 2D Echocardiogram has been performed.  Phillip Mcdowell G Phillip Mcdowell 09/28/2016, 2:18 PM

## 2016-09-28 NOTE — Social Work (Signed)
CSW consulted with patient familyn per request. They expressed [facility] and wanted to speak with a Artistfinancial counselor.  CSW also encouraged family to follow up at the their local social service office and health department for additional resources.  CSW called in-house financial counselors and left a message.  Keene BreathPatricia Jaysten Essner, LCSW Clinical Social Worker [phone]

## 2016-09-28 NOTE — Progress Notes (Signed)
Subjective: Remains weak in LE but improving. Continues to have memory problems.   Exam: Vitals:   09/27/16 2307 09/28/16 0800  BP: 134/79 119/67  Pulse: 100 76  Resp: 18   Temp: 98.1 F (36.7 C) 98.1 F (36.7 C)    HEENT-  Normocephalic, no lesions, without obvious abnormality.  Normal external eye and conjunctiva.  Normal TM's bilaterally.  Normal auditory canals and external ears. Normal external nose, mucus membranes and septum.  Normal pharynx.    Neuro: Alert but continues to have problems with memory--unable to tell me name of hospital, month, year.  CN: Pupils are equal and round. They are symmetrically reactive from 3-->2 mm. EOMI without nystagmus. Facial sensation is intact to light touch. Face is symmetric at rest with normal strength and mobility. Hearing is intact to conversational voice. Palate elevates symmetrically and uvula is midline. Voice is normal in tone, pitch and quality. Bilateral SCM and trapezii are 5/5. Tongue is midline with normal bulk and mobility.  Motor: Normal bulk, tone, and strength. 5/5 throughout UE and 3/5 in LE.   Sensation: Intact to light touch.  DTRs: 2+, symmetric  Toes downgoing bilaterally.positive cross abductors and pectoral.      Pertinent Labs/Diagnostics:  EEG--no epileptiform activity MRI Cervical, thoracic  WBC down to 11.2  Felicie MornDavid Smith PA-C [facility] [phone]  Impression: 36 year old male with confusion, gait dysfunction, hyperreflexia. He admits to overdosing on heroin the night prior to presentation. 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 Rhabdomyolysis could also be causing his weakness, but would not be expected to cause his hyperreflexia.   Recommendations: 1) MRI Cervical/ thoracic spine 2) supportive care for  cerebral injury, likely will need PT/OT 3) neurology will continue to follow   Ritta SlotMcNeill Syriah Delisi, MD [facility] [phone]  If 7pm- 7am, please page neurology on call as listed in AMION.  09/28/2016, 11:40 AM

## 2016-09-28 NOTE — Plan of Care (Signed)
Problem: Nutrition: Goal: Adequate nutrition will be maintained Outcome: Progressing Discussed with Patient why he is NPO. Explained to him that some of his labs were high and the MD wanted to check his levels again in the morning. Pt understood but is very forgetful and will call out a couple min. Later. Reinforced education

## 2016-09-29 ENCOUNTER — Inpatient Hospital Stay (HOSPITAL_COMMUNITY): Payer: Medicaid Other

## 2016-09-29 DIAGNOSIS — K8500 Idiopathic acute pancreatitis without necrosis or infection: Secondary | ICD-10-CM

## 2016-09-29 MED ORDER — VITAMIN B-1 100 MG PO TABS: 100.0000 mg | ORAL_TABLET | Freq: Every day | ORAL | Status: DC

## 2016-09-29 MED ORDER — LORAZEPAM 1 MG PO TABS
1.0000 mg | ORAL_TABLET | Freq: Four times a day (QID) | ORAL | Status: AC | PRN
  Administered 2016-09-29 – 2016-10-02 (×7): 1 mg via ORAL
  Filled 2016-09-29 (×7): qty 1

## 2016-09-29 MED ORDER — LORAZEPAM 2 MG/ML IJ SOLN
2.0000 mg | Freq: Once | INTRAMUSCULAR | Status: AC
  Administered 2016-09-29: 2 mg via INTRAVENOUS
  Filled 2016-09-29: qty 1

## 2016-09-29 MED ORDER — QUETIAPINE FUMARATE 25 MG PO TABS
25.0000 mg | ORAL_TABLET | Freq: Every day | ORAL | Status: DC
  Administered 2016-09-29: 25 mg via ORAL
  Filled 2016-09-29 (×2): qty 1

## 2016-09-29 MED ORDER — NICOTINE 7 MG/24HR TD PT24
7.0000 mg | MEDICATED_PATCH | Freq: Every day | TRANSDERMAL | Status: DC
  Administered 2016-09-29: 7 mg via TRANSDERMAL
  Filled 2016-09-29 (×2): qty 1

## 2016-09-29 MED ORDER — THIAMINE HCL 100 MG/ML IJ SOLN: 100.0000 mg | Freq: Every day | INTRAMUSCULAR | Status: DC

## 2016-09-29 MED ORDER — ADULT MULTIVITAMIN W/MINERALS CH: 1.0000 | ORAL_TABLET | Freq: Every day | ORAL | Status: DC

## 2016-09-29 MED ORDER — KETOROLAC TROMETHAMINE 15 MG/ML IJ SOLN
15.0000 mg | Freq: Once | INTRAMUSCULAR | Status: AC
  Administered 2016-09-30: 15 mg via INTRAVENOUS
  Filled 2016-09-29: qty 1

## 2016-09-29 MED ORDER — LORAZEPAM 2 MG/ML IJ SOLN
1.0000 mg | Freq: Four times a day (QID) | INTRAMUSCULAR | Status: AC | PRN
  Administered 2016-09-30 – 2016-10-01 (×4): 1 mg via INTRAVENOUS
  Filled 2016-09-29 (×4): qty 1

## 2016-09-29 MED ORDER — FOLIC ACID 1 MG PO TABS: 1.0000 mg | ORAL_TABLET | Freq: Every day | ORAL | Status: DC

## 2016-09-29 NOTE — Progress Notes (Signed)
Subjective: Remains weak in LE but improving. Continues to have memory problems.   Exam: Vitals:   09/29/16 1245 09/29/16 1700  BP: 130/74   Pulse: 95 96  Resp: 16   Temp: 98.6 F (37 C)     HEENT-  Normocephalic, no lesions, without obvious abnormality.  Normal external eye and conjunctiva.  Normal TM's bilaterally.  Normal auditory canals and external ears. Normal external nose, mucus membranes and septum.  Normal pharynx.    Neuro: Alert but continues to have problems with memory.  CN: Pupils are equal and round. They are symmetrically reactive EOMI without nystagmus. Facial sensation is intact to light touch. Face is symmetric at rest with normal strength and mobility. Hearing is intact to conversational voice. Palate elevates symmetrically and uvula is midline. Voice is normal in tone, pitch and quality. Bilateral SCM and trapezii are 5/5. Tongue is midline with normal bulk and mobility.  Motor: Normal bulk, tone, and strength. 5/5 throughout UE and 3-4/5 in LE, Improving Sensation: Intact to light touch.  DTRs: 3+, symmetric  Toes downgoing bilaterally.positive cross abductors and pectoral.    Impression: 36 year old male with confusion, gait dysfunction, hyperreflexia. He admits to overdosing on heroin the night prior to presentation. 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 Rhabdomyolysis could also be causing his weakness, but would not be expected to cause his hyperreflexia. Whether the overdose itself was responsible for the rhabdomyolysis or whether he was immobile causing rhabdo is unclear.    Recommendations: 1) MRI Cervical/ thoracic spine 2) supportive care for cerebral injury, likely will need PT/OT/ST(cognitive) 3) neurology will continue to follow   Ritta SlotMcNeill Kirkpatrick, MD [facility] [phone]  If 7pm- 7am, please page neurology on call as listed in AMION.  09/29/2016, 7:57 PM

## 2016-09-29 NOTE — Progress Notes (Signed)
Patient was placed on cardiac monitor telebox #4 but at this time he took all the leads off with the box and placed it on the bedside table;patient educated but still refused;updated CCMD and doc on call about this matter; awaiting for orders.

## 2016-09-29 NOTE — Progress Notes (Signed)
Patient's aunt asked that something be given to patient because he is screaming and yelling. RN assessed patient who then asked for something for pain as well. MD notified. Awaiting further orders.

## 2016-09-29 NOTE — Progress Notes (Signed)
Pt still refused to be placed on cardiac monitor;CCMD made aware.

## 2016-09-29 NOTE — Progress Notes (Signed)
Pt's MRI was again attempted after Ativan.  I explained to the nurse that all we could do is try, being as we have been unable to scan him due to being uncooperative.  Attempted his cervical and thoracic spine exams.  Pt allowed us do scan his cervical spine without contrast and then told us that he was 'done' and didn't want to do anymore.  Pt removed from scanner, sent back to floor and images acquired were turned in.  Report to follow.

## 2016-09-29 NOTE — Progress Notes (Addendum)
PROGRESS NOTE                                                                                                                                                                                                             Patient Demographics:    Phillip Mcdowell, is a 36 y.o. male, DOB - January 12, 1981, ZOX:[id number]  Admit date - 09/26/2016   Admitting Physician Standley Brooking, MD  Outpatient Primary MD for the patient is Patient, No Pcp Per  LOS - 3  Outpatient Specialists:None  Chief Complaint  Patient presents with  . Back Pain       Brief Narrative  36 year old male with polysubstance abuse (alcohol, injected heroin with fentanyl) presented to the ED with inability to walk, low back pain, left leg pain and numbness. As per his mother patient has had frequent falls in the past 2 weeks. In the ED workup showed multiple abnormalities including leukocytosis, elevated LFTs, rhabdomyolysis. CT of the head showed abnormal appearance of the temporal horns of the lateral ventricles uncertain for meningitis versus drug use. Patient was transferred to [facility] for further evaluation.    Subjective:   Patient remains confused. He is able to move his left leg better and the pain has also improved from yesterday. Last night he was extremely agitated and removed telemetry monitor. This morning he was sent down for MRI of the cervical and thoracic spine but was agitated and would not cooperate.   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 Also shows satellite lacunar infarcts. Patient also has hyperreflexia on exam and neurology recommended MRI of the cervical and  thoracic spine. PT/OT eval. Antibiotic discontinued as meningitis unlikely.   Acute encephalopathy Possibly secondary to hippocampal infarct. Is completely non-oriented. EEG with diffuse nonspecific cerebral dysfunction .  Left hip joint effusion Suspect inflammatory as per centimeter fluid aspirated. Cultures negative and negative for crystals. No pain or tenderness on hip movement. Discontinued empiric antibiotics.  Acute transaminitis secondary to rhabdomyolysis.  Slowly improving. Avoid hepatotoxic agents or use with caution.  Elevated troponin Secondary to rhabdomyolysis. No chest pain or  significant EKG changes.  HCV antibody positive Needs outpatient follow-up.  Possible pancreatitis Elevated lipase with mild retroperitoneal edema and pancreatic thickening. Lipase trended down. No abdominal pain or tenderness. Has active alcohol use.  Dehydration and hyponatremia Improved with IV fluids  Polysubstance use Daily alcohol and marijuana use. Urine toxin positive for amphetamines, opiates and benzodiazepines. Prior IV heroin use. Return CIWA.  Agitation Add low-dose Seroquel at bedtime.  Code Status : Full code  Family Communication  : Aunt at bedside  Disposition Plan  : Pending hospital course  Barriers For Discharge : Active symptoms  Consults  :  Neurology IR  Procedures  : CT head/cervical spine MRI brain EEG 2-D echo Hip aspiration  DVT Prophylaxis  :  Lovenox -  Lab Results  Component Value Date   PLT 262 09/28/2016    Antibiotics  :    Anti-infectives    Start     Dose/Rate Route Frequency Ordered Stop   09/27/16 0000  cefTRIAXone (ROCEPHIN) 2 g in dextrose 5 % 50 mL IVPB  Status:  Discontinued     2 g 100 mL/hr over 30 Minutes Intravenous Every 12 hours 09/26/16 1716 09/28/16 1345   09/26/16 2000  vancomycin (VANCOCIN) IVPB 1000 mg/200 mL premix  Status:  Discontinued     1,000 mg 200 mL/hr over 60 Minutes Intravenous Every 8 hours  09/26/16 1724 09/28/16 1345   09/26/16 1130  cefTRIAXone (ROCEPHIN) 2 g in dextrose 5 % 50 mL IVPB     2 g 100 mL/hr over 30 Minutes Intravenous  Once 09/26/16 1117 09/26/16 1157   09/26/16 1130  vancomycin (VANCOCIN) IVPB 1000 mg/200 mL premix     1,000 mg 200 mL/hr over 60 Minutes Intravenous  Once 09/26/16 1117 09/26/16 1234   09/26/16 1130  acyclovir (ZOVIRAX) 815 mg in dextrose 5 % 150 mL IVPB     10 mg/kg  81.6 kg 166.3 mL/hr over 60 Minutes Intravenous  Once 09/26/16 1119 09/26/16 1355        Objective:   Vitals:   09/28/16 2026 09/29/16 0444 09/29/16 1245 09/29/16 1700  BP: 134/73 129/70 130/74   Pulse: 85 67 95 96  Resp: 18 18 16    Temp: 97.9 F (36.6 C) 98.2 F (36.8 C) 98.6 F (37 C)   TempSrc: Oral Oral Oral   SpO2: 95% 96% 98%   Weight:      Height:        Wt Readings from Last 3 Encounters:  09/26/16 81.6 kg (180 lb)  05/01/15 81.6 kg (180 lb)  09/07/14 83.9 kg (185 lb)     Intake/Output Summary (Last 24 hours) at 09/29/16 1727 Last data filed at 09/29/16 1430  Gross per 24 hour  Intake              720 ml  Output              800 ml  Net              -80 ml     Physical Exam  Gen: not in distress HEENT:, moist mucosa, supple neck Chest: clear b/l, no added sounds CVS: N S1&S2, no murmurs,  GI: soft, NT, ND, Musculoskeletal: warm, no edema, limited mobility of the left leg CNS: AAOX 0, hyperreflexia of the lower extremity    Data Review:    CBC  Recent Labs Lab 09/26/16 0924 09/27/16 0511 09/28/16 1106  WBC 18.5* 24.3* 11.2*  HGB 18.1* 18.2* 14.7  HCT  50.5 49.9 42.1  PLT 185 327 262  MCV 94.7 92.9 93.6  MCH 34.0 33.9 32.7  MCHC 35.8 36.5* 34.9  RDW 13.6 13.6 13.6  LYMPHSABS 1.3 1.3  --   MONOABS 1.5* 2.1*  --   EOSABS 0.2 0.0  --   BASOSABS 0.0 0.0  --     Chemistries   Recent Labs Lab 09/26/16 0924 09/27/16 0511 09/28/16 1106  NA 128* 134* 134*  K 4.0 4.9 3.9  CL 99* 100* 103  CO2 21* 23 23  GLUCOSE 149* 132*  99  BUN 12 11 15   CREATININE 0.97 1.15 1.00  CALCIUM 8.6* 9.2 8.5*  AST 719* 645* 360*  ALT 231* 236* 152*  ALKPHOS 93 87 55  BILITOT 1.6* 1.0 0.9   ------------------------------------------------------------------------------------------------------------------ No results for input(s): CHOL, HDL, LDLCALC, TRIG, CHOLHDL, LDLDIRECT in the last 72 hours.  No results found for: HGBA1C ------------------------------------------------------------------------------------------------------------------ No results for input(s): TSH, T4TOTAL, T3FREE, THYROIDAB in the last 72 hours.  Invalid input(s): FREET3 ------------------------------------------------------------------------------------------------------------------ No results for input(s): VITAMINB12, FOLATE, FERRITIN, TIBC, IRON, RETICCTPCT in the last 72 hours.  Coagulation profile No results for input(s): INR, PROTIME in the last 168 hours.  No results for input(s): DDIMER in the last 72 hours.  Cardiac Enzymes  Recent Labs Lab 09/26/16 0924 09/26/16 1545  TROPONINI 0.58* 0.57*   ------------------------------------------------------------------------------------------------------------------ No results found for: BNP  Inpatient Medications  Scheduled Meds: . folic acid  1 mg Oral Daily  . multivitamin with minerals  1 tablet Oral Daily  . QUEtiapine  25 mg Oral QHS  . thiamine  100 mg Oral Daily   Continuous Infusions: . sodium chloride 100 mL/hr at 09/28/16 0842   PRN Meds:.acetaminophen **OR** acetaminophen, ondansetron **OR** ondansetron (ZOFRAN) IV  Micro Results Recent Results (from the past 240 hour(s))  Blood culture (routine x 2)     Status: None (Preliminary result)   Collection Time: 09/26/16  9:25 AM  Result Value Ref Range Status   Specimen Description   Final    BLOOD BLOOD RIGHT WRIST BOTTLES DRAWN AEROBIC AND ANAEROBIC   Special Requests   Final    Blood Culture results may not be optimal due to  an inadequate volume of blood received in culture bottles   Culture NO GROWTH 3 DAYS  Final   Report Status PENDING  Incomplete  Anaerobic culture     Status: None (Preliminary result)   Collection Time: 09/26/16  7:15 PM  Result Value Ref Range Status   Specimen Description SYNOVIAL FLUID LEFT HIP  Final   Special Requests NONE  Final   Culture   Final    NO ANAEROBES ISOLATED; CULTURE IN PROGRESS FOR 5 DAYS   Report Status PENDING  Incomplete  Body fluid culture     Status: None (Preliminary result)   Collection Time: 09/26/16  7:15 PM  Result Value Ref Range Status   Specimen Description SYNOVIAL FLUID LEFT HIP  Final   Special Requests NONE  Final   Gram Stain   Final    MODERATE WBC PRESENT, PREDOMINANTLY PMN NO ORGANISMS SEEN    Culture NO GROWTH 3 DAYS  Final   Report Status PENDING  Incomplete    Radiology Reports Ct Head Wo Contrast  Result Date: 09/27/2016 CLINICAL DATA:  36 y/o  M; substance abuse and abnormal MRI. EXAM: CT HEAD WITHOUT CONTRAST TECHNIQUE: Contiguous axial images were obtained from the base of the skull through the vertex without intravenous contrast. COMPARISON:  09/27/2016 MRI  head. FINDINGS: Brain: Hypoattenuation and mild mass effect of the hippocampi by corresponding to reduced diffusion on prior MRI. Punctate foci of reduced diffusion on MRI are not visible on CT. No evidence for new acute intracranial hemorrhage, infarct, focal mass effect, hydrocephalus, or extra-axial collection. Vascular: No hyperdense vessel or unexpected calcification. Skull: Normal. Negative for fracture or focal lesion. Sinuses/Orbits: No acute finding. Other: None. IMPRESSION: 1. Hypoattenuation and mild mass effect of the hippocampi corresponding to diffusion abnormality on MRI. 2. No evidence for new acute intracranial hemorrhage, acute infarct, or focal mass effect. Electronically Signed   By: Mitzi Hansen M.D.   On: 09/27/2016 21:40   Ct Head Wo Contrast  Result  Date: 09/26/2016 CLINICAL DATA:  Numbness in legs.  Found down. EXAM: CT HEAD WITHOUT CONTRAST CT CERVICAL SPINE WITHOUT CONTRAST TECHNIQUE: Multidetector CT imaging of the head and cervical spine was performed following the standard protocol without intravenous contrast. Multiplanar CT image reconstructions of the cervical spine were also generated. COMPARISON:  None. FINDINGS: CT HEAD FINDINGS Brain: Ventricles are normal in size and configuration. However, temporal horns of the lateral ventricles have an abnormal appearance bilaterally, perhaps indicating adjacent hippocampal edema and/or debris related to underlying meningitis. Remainder of the brain demonstrate normal gray-white matter attenuation. No parenchymal or extra-axial hemorrhage. Vascular: No hyperdense vessel or unexpected calcification. Skull: Normal. Negative for fracture or focal lesion. Sinuses/Orbits: No acute finding. Other: None. CT CERVICAL SPINE FINDINGS Alignment: Dextroscoliosis. No evidence of acute vertebral body subluxation. Skull base and vertebrae: No fracture line or displaced fracture fragment identified. Facet joints appear intact and normal in alignment. Soft tissues and spinal canal: No prevertebral fluid or swelling. No visible canal hematoma. Disc levels: Disc spaces are well preserved throughout. No significant central canal stenosis at any level. Upper chest: Mild emphysematous change at the lung apices. No acute findings. Other: None. IMPRESSION: 1. Temporal horns of the lateral ventricles have an abnormal appearance bilaterally, perhaps indicating adjacent hippocampal edema and/or debris related to underlying meningitis. This appearance can also be related to drug use. Recommend brain MRI with contrast for further characterization. 2. No fracture or acute subluxation within the cervical spine. Scoliosis. 3. Emphysema. These results were called by telephone at the time of interpretation on 09/26/2016 at 11:11 am to Dr. Margarita Grizzle , who verbally acknowledged these results. Electronically Signed   By: Bary Richard M.D.   On: 09/26/2016 11:12   Ct Cervical Spine Wo Contrast  Result Date: 09/26/2016 CLINICAL DATA:  Numbness in legs.  Found down. EXAM: CT HEAD WITHOUT CONTRAST CT CERVICAL SPINE WITHOUT CONTRAST TECHNIQUE: Multidetector CT imaging of the head and cervical spine was performed following the standard protocol without intravenous contrast. Multiplanar CT image reconstructions of the cervical spine were also generated. COMPARISON:  None. FINDINGS: CT HEAD FINDINGS Brain: Ventricles are normal in size and configuration. However, temporal horns of the lateral ventricles have an abnormal appearance bilaterally, perhaps indicating adjacent hippocampal edema and/or debris related to underlying meningitis. Remainder of the brain demonstrate normal gray-white matter attenuation. No parenchymal or extra-axial hemorrhage. Vascular: No hyperdense vessel or unexpected calcification. Skull: Normal. Negative for fracture or focal lesion. Sinuses/Orbits: No acute finding. Other: None. CT CERVICAL SPINE FINDINGS Alignment: Dextroscoliosis. No evidence of acute vertebral body subluxation. Skull base and vertebrae: No fracture line or displaced fracture fragment identified. Facet joints appear intact and normal in alignment. Soft tissues and spinal canal: No prevertebral fluid or swelling. No visible canal hematoma. Disc levels: Disc spaces  are well preserved throughout. No significant central canal stenosis at any level. Upper chest: Mild emphysematous change at the lung apices. No acute findings. Other: None. IMPRESSION: 1. Temporal horns of the lateral ventricles have an abnormal appearance bilaterally, perhaps indicating adjacent hippocampal edema and/or debris related to underlying meningitis. This appearance can also be related to drug use. Recommend brain MRI with contrast for further characterization. 2. No fracture or acute subluxation  within the cervical spine. Scoliosis. 3. Emphysema. These results were called by telephone at the time of interpretation on 09/26/2016 at 11:11 am to Dr. Margarita Grizzle , who verbally acknowledged these results. Electronically Signed   By: Bary Richard M.D.   On: 09/26/2016 11:12   Ct Thoracic Spine Wo Contrast  Result Date: 09/26/2016 CLINICAL DATA:  Suspected fall yesterday.  Limited history. EXAM: CT THORACIC AND LUMBAR SPINE WITHOUT CONTRAST TECHNIQUE: Multidetector CT imaging of the thoracic and lumbar spine was performed without contrast. Multiplanar CT image reconstructions were also generated. COMPARISON:  None. FINDINGS: CT THORACIC SPINE FINDINGS Alignment: Mild exaggerated kyphosis.  No traumatic malalignment. Vertebrae: Negative for fracture or bone lesion. Paraspinal and other soft tissues: No acute finding. 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 Disc levels: No evidence of degenerative impingement. Ligamentum flavum thickening in ossification at T4-5 and T5-6. CT LUMBAR SPINE FINDINGS Segmentation: 5 lumbar type vertebrae. Alignment: Normal. Vertebrae: No acute fracture or focal pathologic process. Paraspinal and other soft tissues: Mild retroperitoneal edema centered in the central and left upper quadrant. The pancreatic tail appears somewhat thickened and indistinct diffusely. Disc levels: No significant degenerative changes or evidence of impingement. IMPRESSION: CT THORACIC SPINE IMPRESSION No evidence of injury. CT LUMBAR SPINE IMPRESSION 1. No evidence of lumbar spine injury. 2. Mild retroperitoneal edema and pancreatic tail thickening, please correlate for pancreatitis by exam and labs. Electronically Signed   By: Marnee Spring M.D.   On: 09/26/2016 10:58   Ct Lumbar Spine Wo Contrast  Result Date: 09/26/2016 CLINICAL DATA:  Suspected fall yesterday.  Limited history. EXAM: CT THORACIC AND LUMBAR SPINE WITHOUT CONTRAST TECHNIQUE: Multidetector CT imaging of the thoracic and lumbar spine was  performed without contrast. Multiplanar CT image reconstructions were also generated. COMPARISON:  None. FINDINGS: CT THORACIC SPINE FINDINGS Alignment: Mild exaggerated kyphosis.  No traumatic malalignment. Vertebrae: Negative for fracture or bone lesion. Paraspinal and other soft tissues: No acute finding. 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 Disc levels: No evidence of degenerative impingement. Ligamentum flavum thickening in ossification at T4-5 and T5-6. CT LUMBAR SPINE FINDINGS Segmentation: 5 lumbar type vertebrae. Alignment: Normal. Vertebrae: No acute fracture or focal pathologic process. Paraspinal and other soft tissues: Mild retroperitoneal edema centered in the central and left upper quadrant. The pancreatic tail appears somewhat thickened and indistinct diffusely. Disc levels: No significant degenerative changes or evidence of impingement. IMPRESSION: CT THORACIC SPINE IMPRESSION No evidence of injury. CT LUMBAR SPINE IMPRESSION 1. No evidence of lumbar spine injury. 2. Mild retroperitoneal edema and pancreatic tail thickening, please correlate for pancreatitis by exam and labs. Electronically Signed   By: Marnee Spring M.D.   On: 09/26/2016 10:58   Ct Pelvis Wo Contrast  Result Date: 09/26/2016 CLINICAL DATA:  Bilateral lower extremity numbness. The patient is unable to walk. Possible fall yesterday. Elevated white blood cell count. EXAM: CT PELVIS WITHOUT CONTRAST TECHNIQUE: Multidetector CT imaging of the pelvis was performed following the standard protocol without intravenous contrast. COMPARISON:  None. FINDINGS: Urinary Tract:  Negative. Bowel:  Imaged bowel loops and  the appendix appear normal. Vascular/Lymphatic: No pathologically enlarged lymph nodes. No significant vascular abnormality seen. Reproductive:  Negative. Other:  No intra-abdominal fluid collection. Musculoskeletal: Multifocal muscular edema is identified. Involved muscles include the right gluteus maximus and medius, left  gluteus medius and minimus and left obturator externus. Left hip joint effusion is identified. Subcutaneous stranding is seen about the right buttock and left greater trochanter. No bony destructive change or periosteal reaction is identified. No avascular necrosis of the femoral heads. Small synovial herniation pit about both hips are noted. IMPRESSION: Multifocal muscle edema about the pelvis most most worrisome for infectious myositis in this patient with an elevated white blood cell count. Rhabdomyolysis is possible. Left hip joint effusion worrisome for septic joint given elevated white count. No evidence of osteomyelitis. Subcutaneous stranding about the left hip and right buttock likely due to cellulitis. Electronically Signed   By: Drusilla Kanner M.D.   On: 09/26/2016 11:17   Mr Brain Wo Contrast  Addendum Date: 09/27/2016   ADDENDUM REPORT: 09/27/2016 13:59 ADDENDUM: Acute findings discussed with and reconfirmed by Dr.Kirkpatrick, Neurology on 09/27/2016 at 1:45pm. Electronically Signed   By: Awilda Metro M.D.   On: 09/27/2016 13:59   Result Date: 09/27/2016 CLINICAL DATA:  Weakness and pain, follow-up abnormal temporal horns. Evaluate for meningitis. History of polysubstance abuse. EXAM: MRI HEAD WITHOUT CONTRAST TECHNIQUE: Multiplanar, multiecho pulse sequences of the brain and surrounding structures were obtained without intravenous contrast. COMPARISON:  CT HEAD September 26, 2016 FINDINGS: Multiple sequences are mildly or moderately motion degraded. INTRACRANIAL CONTENTS: Symmetric reduced diffusion bilateral hippocampi with low ADC values and bright FLAIR T2 hyperintense signal. Subcentimeter foci of reduced diffusion bilateral basal ganglia, LEFT cerebellum. No intraventricular reduced diffusion. No abnormal susceptibility artifact to suggest hemorrhage. No midline shift, mass effect or masses. No abnormal extra-axial fluid collections. VASCULAR: Normal major intracranial vascular flow voids  present at skull base. SKULL AND UPPER CERVICAL SPINE: No abnormal sellar expansion. No suspicious calvarial bone marrow signal. Craniocervical junction maintained. SINUSES/ORBITS: The mastoid air-cells and included paranasal sinuses are well-aerated.The included ocular globes and orbital contents are non-suspicious. OTHER: None. IMPRESSION: 1. 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 Findings less likely reflect herpes or limbic encephalitis. Additional satellite lacunar infarcts. 2. No imaging findings of meningitis by noncontrast MRI. Electronically Signed: By: Awilda Metro M.D. On: 09/27/2016 13:51   Dg Chest Port 1 View  Result Date: 09/26/2016 CLINICAL DATA:  Pain. EXAM: PORTABLE CHEST 1 VIEW COMPARISON:  None. FINDINGS: Normal heart size and mediastinal contours. No acute infiltrate or edema. No effusion or pneumothorax. No acute osseous findings. IMPRESSION: Negative portable chest. Electronically Signed   By: Marnee Spring M.D.   On: 09/26/2016 09:25   Dg Fluoro Guided Needle Plc Aspiration/injection Loc  Result Date: 09/28/2016 CLINICAL DATA:  Bilateral lower extremity numbness. Left hip fluid noted on recent imaging. History of polysubstance abuse. EXAM: LEFT HIP ASPIRATION UNDER FLUOROSCOPY FLUOROSCOPY TIME:  Fluoroscopy Time:  30 seconds Number of Acquired Spot Images: 3 PROCEDURE: Informed consent was obtained from the patient's parents. The patient's mental status was altered and he was unable to give consent. Left common femoral vein was palpated. Left femoral head and neck region was identified with fluoroscopy. Skin was prepped with Betadine. Sterile field was created. Skin was anesthetized with 1% lidocaine. 20 gauge spinal needle was directed onto the lateral aspect of the left femoral head and neck junction with fluoroscopic guidance. 2 mL of yellow joint fluid was aspirated.  Needle was removed without complication. Bandage placed over the  puncture site. IMPRESSION: Technically successful left hip aspiration with fluoroscopy. Fluid was sent for analysis. Electronically Signed   By: Richarda OverlieAdam  Henn M.D.   On: 09/28/2016 07:49   [place] Abdomen Limited Ruq  Result Date: 09/26/2016 CLINICAL DATA:  Initial evaluation for elevated transaminitis. EXAM: ULTRASOUND ABDOMEN LIMITED RIGHT UPPER QUADRANT COMPARISON:  None available. FINDINGS: Gallbladder: Evaluation of the gallbladder somewhat limited due to overlying bowel gas and patient positioning. No definite echogenic stones or sludge identified. Gallbladder wall measure within normal limits a 2.3 mm. No free pericholecystic fluid. No sonographic Murphy sign elicited on exam. Common bile duct: Diameter: 2.9 mm Liver: No focal lesion identified. Within normal limits in parenchymal echogenicity. IMPRESSION: Grossly normal right upper quadrant ultrasound without cholelithiasis, evidence for acute cholecystitis, or biliary dilatation. Please note that evaluation of the gallbladder mildly limited due to shadowing from overlying bowel gas and patient positioning. Electronically Signed   By: Rise MuBenjamin  McClintock M.D.   On: 09/26/2016 21:43    Time Spent in minutes  35   Eddie NorthHUNGEL, Venetta Knee M.D on 09/29/2016 at 5:27 PM  Between 7am to 7pm - Pager - [phone]  After 7pm go to [email or web address] - password [email or web address]  [facility] -  Office  [phone]

## 2016-09-30 LAB — COMPREHENSIVE METABOLIC PANEL
ALT: 148 U/L — AB (ref 17–63)
AST: 354 U/L — AB (ref 15–41)
Albumin: 2.7 g/dL — ABNORMAL LOW (ref 3.5–5.0)
Alkaline Phosphatase: 43 U/L (ref 38–126)
Anion gap: 10 (ref 5–15)
BILIRUBIN TOTAL: 0.9 mg/dL (ref 0.3–1.2)
BUN: 9 mg/dL (ref 6–20)
CHLORIDE: 106 mmol/L (ref 101–111)
CO2: 24 mmol/L (ref 22–32)
CREATININE: 0.91 mg/dL (ref 0.61–1.24)
Calcium: 8.6 mg/dL — ABNORMAL LOW (ref 8.9–10.3)
GFR calc Af Amer: >60 mL/min (ref >60)
Glucose, Bld: 148 mg/dL — ABNORMAL HIGH (ref 65–99)
Potassium: 2.8 mmol/L — ABNORMAL LOW (ref 3.5–5.1)
Sodium: 140 mmol/L (ref 135–145)
TOTAL PROTEIN: 5.2 g/dL — AB (ref 6.5–8.1)

## 2016-09-30 LAB — BODY FLUID CULTURE: CULTURE: NO GROWTH

## 2016-09-30 LAB — MAGNESIUM: MAGNESIUM: 1.7 mg/dL (ref 1.7–2.4)

## 2016-09-30 LAB — CK: Total CK: 11038 U/L — ABNORMAL HIGH (ref 49–397)

## 2016-09-30 MED ORDER — POTASSIUM CHLORIDE CRYS ER 20 MEQ PO TBCR
40.0000 meq | EXTENDED_RELEASE_TABLET | ORAL | Status: AC
  Administered 2016-09-30 (×2): 40 meq via ORAL
  Filled 2016-09-30 (×2): qty 2

## 2016-09-30 MED ORDER — QUETIAPINE FUMARATE 50 MG PO TABS
50.0000 mg | ORAL_TABLET | Freq: Every day | ORAL | Status: DC
  Administered 2016-09-30 – 2016-10-04 (×5): 50 mg via ORAL
  Filled 2016-09-30 (×6): qty 1

## 2016-09-30 MED ORDER — NICOTINE 21 MG/24HR TD PT24
21.0000 mg | MEDICATED_PATCH | Freq: Every day | TRANSDERMAL | Status: DC
  Administered 2016-09-30 – 2016-10-06 (×7): 21 mg via TRANSDERMAL
  Filled 2016-09-30 (×9): qty 1

## 2016-09-30 NOTE — Progress Notes (Signed)
PROGRESS NOTE                                                                                                                                                                                                             Patient Demographics:    Phillip Mcdowell, is a 36 y.o. male, DOB - [birth year], QMV:[id number]  Admit date - 09/26/2016   Admitting Physician Standley Brooking, MD  Outpatient Primary MD for the patient is Patient, No Pcp Per  LOS - 4  Outpatient Specialists:None  Chief Complaint  Patient presents with  . Back Pain       Brief Narrative  36 year old male with polysubstance abuse (alcohol, injected heroin with fentanyl) presented to the ED with inability to walk, low back pain, left leg pain and numbness. As per his mother patient has had frequent falls in the past 2 weeks. In the ED workup showed multiple abnormalities including leukocytosis, elevated LFTs, rhabdomyolysis. CT of the head showed abnormal appearance of the temporal horns of the lateral ventricles uncertain for meningitis versus drug use. Patient was transferred to [facility] for further evaluation.    Subjective:   Patient reportedly was again agitated last night requiring Ativan. This morning he seems slightly better oriented (knows in the hospital but thinks this is [place] , knows the year). Reports having weakness of his right leg today. Denies any pain.   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 Also shows satellite lacunar infarcts. Hyperreflexia on exam is likely due to basal ganglia changes. MRI of the cervical spine without abnormal cord or bone marrow signal.  Shows C5-C6 foraminal disc protrusion with mild to moderate foraminal stenosis. Antibiotic discontinued as meningitis unlikely.   Acute encephalopathy Possibly secondary to hippocampal infarct. Orientation slightly better but still poor. EEG with diffuse nonspecific cerebral dysfunction . Neurology suggests overall symptomatic recovery days unlikely versus prolonged. Needs PT/OT and speech therapy for cognition.  Left hip joint effusion Suspect inflammatory from synovial fluid analysis. Cultures negative and negative for crystals. No pain or tenderness on hip movement. Discontinued empiric antibiotics.  Acute transaminitis secondary  to rhabdomyolysis.  Slowly improving. Avoid hepatotoxic agents or use with caution.  Hypokalemia Replenished. Check magnesium.  Elevated troponin Secondary to rhabdomyolysis. No chest pain or significant EKG changes.  HCV antibody positive Needs outpatient follow-up.  Possible pancreatitis Elevated lipase with mild retroperitoneal edema and pancreatic thickening. Lipase trended down. No abdominal pain or tenderness. Has active alcohol use.  Dehydration and hyponatremia Improved with IV fluids  Polysubstance use Daily alcohol and marijuana use. Urine toxin positive for amphetamines, opiates and benzodiazepines. Prior IV heroin use. On CIWA  Agitation  low-dose Seroquel at bedtime. Monitor on CIWA.  Code Status : Full code  Family Communication  : Aunt at bedside  Disposition Plan  : Pending hospital course  Barriers For Discharge : Active symptoms  Consults  :  Neurology IR  Procedures  : CT head/cervical spine MRI brain EEG 2-D echo Hip aspiration  DVT Prophylaxis  :  Lovenox -  Lab Results  Component Value Date   PLT 262 09/28/2016    Antibiotics  :    Anti-infectives    Start     Dose/Rate Route Frequency Ordered Stop   09/27/16 0000  cefTRIAXone (ROCEPHIN) 2 g in dextrose 5 % 50 mL IVPB  Status:  Discontinued     2  g 100 mL/hr over 30 Minutes Intravenous Every 12 hours 09/26/16 1716 09/28/16 1345   09/26/16 2000  vancomycin (VANCOCIN) IVPB 1000 mg/200 mL premix  Status:  Discontinued     1,000 mg 200 mL/hr over 60 Minutes Intravenous Every 8 hours 09/26/16 1724 09/28/16 1345   09/26/16 1130  cefTRIAXone (ROCEPHIN) 2 g in dextrose 5 % 50 mL IVPB     2 g 100 mL/hr over 30 Minutes Intravenous  Once 09/26/16 1117 09/26/16 1157   09/26/16 1130  vancomycin (VANCOCIN) IVPB 1000 mg/200 mL premix     1,000 mg 200 mL/hr over 60 Minutes Intravenous  Once 09/26/16 1117 09/26/16 1234   09/26/16 1130  acyclovir (ZOVIRAX) 815 mg in dextrose 5 % 150 mL IVPB     10 mg/kg  81.6 kg 166.3 mL/hr over 60 Minutes Intravenous  Once 09/26/16 1119 09/26/16 1355        Objective:   Vitals:   09/29/16 1245 09/29/16 1700 09/29/16 2113 09/30/16 0006  BP: 130/74  137/75 134/72  Pulse: 95 96 86 88  Resp: 16  17   Temp: 98.6 F (37 C)  100.1 F (37.8 C)   TempSrc: Oral  Oral   SpO2: 98%  98%   Weight:      Height:        Wt Readings from Last 3 Encounters:  09/26/16 81.6 kg (180 lb)  05/01/15 81.6 kg (180 lb)  09/07/14 83.9 kg (185 lb)     Intake/Output Summary (Last 24 hours) at 09/30/16 1120 Last data filed at 09/30/16 0244  Gross per 24 hour  Intake              840 ml  Output             1050 ml  Net             -210 ml     Physical Exam Gen.: Not in distress HEENT: Moist mucosa, supple neck Chest: Clear bilaterally CVS: Normal S1 and S2, no murmurs GI: Soft, nontender, nondistended Musculoskeletal : Warm, no edema, includes mobility of the left leg, has some restricted mobility of the right leg CNS: Alert and oriented 1, hyperreflexia of the  lower extremity no back tenderness     Data Review:    CBC  Recent Labs Lab 09/26/16 0924 09/27/16 0511 09/28/16 1106  WBC 18.5* 24.3* 11.2*  HGB 18.1* 18.2* 14.7  HCT 50.5 49.9 42.1  PLT 185 327 262  MCV 94.7 92.9 93.6  MCH 34.0 33.9 32.7   MCHC 35.8 36.5* 34.9  RDW 13.6 13.6 13.6  LYMPHSABS 1.3 1.3  --   MONOABS 1.5* 2.1*  --   EOSABS 0.2 0.0  --   BASOSABS 0.0 0.0  --     Chemistries   Recent Labs Lab 09/26/16 0924 09/27/16 0511 09/28/16 1106 09/30/16 0522  NA 128* 134* 134* 140  K 4.0 4.9 3.9 2.8*  CL 99* 100* 103 106  CO2 21* 23 23 24   GLUCOSE 149* 132* 99 148*  BUN 12 11 15 9   CREATININE 0.97 1.15 1.00 0.91  CALCIUM 8.6* 9.2 8.5* 8.6*  AST 719* 645* 360* 354*  ALT 231* 236* 152* 148*  ALKPHOS 93 87 55 43  BILITOT 1.6* 1.0 0.9 0.9   ------------------------------------------------------------------------------------------------------------------ No results for input(s): CHOL, HDL, LDLCALC, TRIG, CHOLHDL, LDLDIRECT in the last 72 hours.  No results found for: HGBA1C ------------------------------------------------------------------------------------------------------------------ No results for input(s): TSH, T4TOTAL, T3FREE, THYROIDAB in the last 72 hours.  Invalid input(s): FREET3 ------------------------------------------------------------------------------------------------------------------ No results for input(s): VITAMINB12, FOLATE, FERRITIN, TIBC, IRON, RETICCTPCT in the last 72 hours.  Coagulation profile No results for input(s): INR, PROTIME in the last 168 hours.  No results for input(s): DDIMER in the last 72 hours.  Cardiac Enzymes  Recent Labs Lab 09/26/16 0924 09/26/16 1545  TROPONINI 0.58* 0.57*   ------------------------------------------------------------------------------------------------------------------ No results found for: BNP  Inpatient Medications  Scheduled Meds: . folic acid  1 mg Oral Daily  . multivitamin with minerals  1 tablet Oral Daily  . nicotine  7 mg Transdermal Daily  . QUEtiapine  25 mg Oral QHS  . thiamine  100 mg Oral Daily   Continuous Infusions: . sodium chloride 100 mL/hr at 09/28/16 0842   PRN Meds:.acetaminophen **OR** acetaminophen,  LORazepam **OR** LORazepam, ondansetron **OR** ondansetron (ZOFRAN) IV  Micro Results Recent Results (from the past 240 hour(s))  Blood culture (routine x 2)     Status: None (Preliminary result)   Collection Time: 09/26/16  9:25 AM  Result Value Ref Range Status   Specimen Description   Final    BLOOD BLOOD RIGHT WRIST BOTTLES DRAWN AEROBIC AND ANAEROBIC   Special Requests   Final    Blood Culture results may not be optimal due to an inadequate volume of blood received in culture bottles   Culture NO GROWTH 4 DAYS  Final   Report Status PENDING  Incomplete  Anaerobic culture     Status: None (Preliminary result)   Collection Time: 09/26/16  7:15 PM  Result Value Ref Range Status   Specimen Description SYNOVIAL FLUID LEFT HIP  Final   Special Requests NONE  Final   Culture   Final    NO ANAEROBES ISOLATED; CULTURE IN PROGRESS FOR 5 DAYS   Report Status PENDING  Incomplete  Body fluid culture     Status: None   Collection Time: 09/26/16  7:15 PM  Result Value Ref Range Status   Specimen Description SYNOVIAL FLUID LEFT HIP  Final   Special Requests NONE  Final   Gram Stain   Final    MODERATE WBC PRESENT, PREDOMINANTLY PMN NO ORGANISMS SEEN    Culture NO GROWTH 3 DAYS  Final   Report Status 09/30/2016 FINAL  Final    Radiology Reports Ct Head Wo Contrast  Result Date: 09/27/2016 CLINICAL DATA:  36 y/o  M; substance abuse and abnormal MRI. EXAM: CT HEAD WITHOUT CONTRAST TECHNIQUE: Contiguous axial images were obtained from the base of the skull through the vertex without intravenous contrast. COMPARISON:  09/27/2016 MRI head. FINDINGS: Brain: Hypoattenuation and mild mass effect of the hippocampi by corresponding to reduced diffusion on prior MRI. Punctate foci of reduced diffusion on MRI are not visible on CT. No evidence for new acute intracranial hemorrhage, infarct, focal mass effect, hydrocephalus, or extra-axial collection. Vascular: No hyperdense vessel or unexpected  calcification. Skull: Normal. Negative for fracture or focal lesion. Sinuses/Orbits: No acute finding. Other: None. IMPRESSION: 1. Hypoattenuation and mild mass effect of the hippocampi corresponding to diffusion abnormality on MRI. 2. No evidence for new acute intracranial hemorrhage, acute infarct, or focal mass effect. Electronically Signed   By: Mitzi Hansen M.D.   On: 09/27/2016 21:40   Ct Head Wo Contrast  Result Date: 09/26/2016 CLINICAL DATA:  Numbness in legs.  Found down. EXAM: CT HEAD WITHOUT CONTRAST CT CERVICAL SPINE WITHOUT CONTRAST TECHNIQUE: Multidetector CT imaging of the head and cervical spine was performed following the standard protocol without intravenous contrast. Multiplanar CT image reconstructions of the cervical spine were also generated. COMPARISON:  None. FINDINGS: CT HEAD FINDINGS Brain: Ventricles are normal in size and configuration. However, temporal horns of the lateral ventricles have an abnormal appearance bilaterally, perhaps indicating adjacent hippocampal edema and/or debris related to underlying meningitis. Remainder of the brain demonstrate normal gray-white matter attenuation. No parenchymal or extra-axial hemorrhage. Vascular: No hyperdense vessel or unexpected calcification. Skull: Normal. Negative for fracture or focal lesion. Sinuses/Orbits: No acute finding. Other: None. CT CERVICAL SPINE FINDINGS Alignment: Dextroscoliosis. No evidence of acute vertebral body subluxation. Skull base and vertebrae: No fracture line or displaced fracture fragment identified. Facet joints appear intact and normal in alignment. Soft tissues and spinal canal: No prevertebral fluid or swelling. No visible canal hematoma. Disc levels: Disc spaces are well preserved throughout. No significant central canal stenosis at any level. Upper chest: Mild emphysematous change at the lung apices. No acute findings. Other: None. IMPRESSION: 1. Temporal horns of the lateral ventricles have  an abnormal appearance bilaterally, perhaps indicating adjacent hippocampal edema and/or debris related to underlying meningitis. This appearance can also be related to drug use. Recommend brain MRI with contrast for further characterization. 2. No fracture or acute subluxation within the cervical spine. Scoliosis. 3. Emphysema. These results were called by telephone at the time of interpretation on 09/26/2016 at 11:11 am to Dr. Margarita Grizzle , who verbally acknowledged these results. Electronically Signed   By: Bary Richard M.D.   On: 09/26/2016 11:12   Ct Cervical Spine Wo Contrast  Result Date: 09/26/2016 CLINICAL DATA:  Numbness in legs.  Found down. EXAM: CT HEAD WITHOUT CONTRAST CT CERVICAL SPINE WITHOUT CONTRAST TECHNIQUE: Multidetector CT imaging of the head and cervical spine was performed following the standard protocol without intravenous contrast. Multiplanar CT image reconstructions of the cervical spine were also generated. COMPARISON:  None. FINDINGS: CT HEAD FINDINGS Brain: Ventricles are normal in size and configuration. However, temporal horns of the lateral ventricles have an abnormal appearance bilaterally, perhaps indicating adjacent hippocampal edema and/or debris related to underlying meningitis. Remainder of the brain demonstrate normal gray-white matter attenuation. No parenchymal or extra-axial hemorrhage. Vascular: No hyperdense vessel or unexpected calcification. Skull: Normal. Negative for  fracture or focal lesion. Sinuses/Orbits: No acute finding. Other: None. CT CERVICAL SPINE FINDINGS Alignment: Dextroscoliosis. No evidence of acute vertebral body subluxation. Skull base and vertebrae: No fracture line or displaced fracture fragment identified. Facet joints appear intact and normal in alignment. Soft tissues and spinal canal: No prevertebral fluid or swelling. No visible canal hematoma. Disc levels: Disc spaces are well preserved throughout. No significant central canal stenosis at  any level. Upper chest: Mild emphysematous change at the lung apices. No acute findings. Other: None. IMPRESSION: 1. Temporal horns of the lateral ventricles have an abnormal appearance bilaterally, perhaps indicating adjacent hippocampal edema and/or debris related to underlying meningitis. This appearance can also be related to drug use. Recommend brain MRI with contrast for further characterization. 2. No fracture or acute subluxation within the cervical spine. Scoliosis. 3. Emphysema. These results were called by telephone at the time of interpretation on 09/26/2016 at 11:11 am to Dr. Margarita GrizzleANIELLE RAY , who verbally acknowledged these results. Electronically Signed   By: Bary RichardStan  Maynard M.D.   On: 09/26/2016 11:12   Ct Thoracic Spine Wo Contrast  Result Date: 09/26/2016 CLINICAL DATA:  Suspected fall yesterday.  Limited history. EXAM: CT THORACIC AND LUMBAR SPINE WITHOUT CONTRAST TECHNIQUE: Multidetector CT imaging of the thoracic and lumbar spine was performed without contrast. Multiplanar CT image reconstructions were also generated. COMPARISON:  None. FINDINGS: CT THORACIC SPINE FINDINGS Alignment: Mild exaggerated kyphosis.  No traumatic malalignment. Vertebrae: Negative for fracture or bone lesion. Paraspinal and other soft tissues: No acute finding. 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 Disc levels: No evidence of degenerative impingement. Ligamentum flavum thickening in ossification at T4-5 and T5-6. CT LUMBAR SPINE FINDINGS Segmentation: 5 lumbar type vertebrae. Alignment: Normal. Vertebrae: No acute fracture or focal pathologic process. Paraspinal and other soft tissues: Mild retroperitoneal edema centered in the central and left upper quadrant. The pancreatic tail appears somewhat thickened and indistinct diffusely. Disc levels: No significant degenerative changes or evidence of impingement. IMPRESSION: CT THORACIC SPINE IMPRESSION No evidence of injury. CT LUMBAR SPINE IMPRESSION 1. No evidence of lumbar  spine injury. 2. Mild retroperitoneal edema and pancreatic tail thickening, please correlate for pancreatitis by exam and labs. Electronically Signed   By: Marnee SpringJonathon  Watts M.D.   On: 09/26/2016 10:58   Ct Lumbar Spine Wo Contrast  Result Date: 09/26/2016 CLINICAL DATA:  Suspected fall yesterday.  Limited history. EXAM: CT THORACIC AND LUMBAR SPINE WITHOUT CONTRAST TECHNIQUE: Multidetector CT imaging of the thoracic and lumbar spine was performed without contrast. Multiplanar CT image reconstructions were also generated. COMPARISON:  None. FINDINGS: CT THORACIC SPINE FINDINGS Alignment: Mild exaggerated kyphosis.  No traumatic malalignment. Vertebrae: Negative for fracture or bone lesion. Paraspinal and other soft tissues: No acute finding. 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 Disc levels: No evidence of degenerative impingement. Ligamentum flavum thickening in ossification at T4-5 and T5-6. CT LUMBAR SPINE FINDINGS Segmentation: 5 lumbar type vertebrae. Alignment: Normal. Vertebrae: No acute fracture or focal pathologic process. Paraspinal and other soft tissues: Mild retroperitoneal edema centered in the central and left upper quadrant. The pancreatic tail appears somewhat thickened and indistinct diffusely. Disc levels: No significant degenerative changes or evidence of impingement. IMPRESSION: CT THORACIC SPINE IMPRESSION No evidence of injury. CT LUMBAR SPINE IMPRESSION 1. No evidence of lumbar spine injury. 2. Mild retroperitoneal edema and pancreatic tail thickening, please correlate for pancreatitis by exam and labs. Electronically Signed   By: Marnee SpringJonathon  Watts M.D.   On: 09/26/2016 10:58   Ct Pelvis Wo  Contrast  Result Date: 09/26/2016 CLINICAL DATA:  Bilateral lower extremity numbness. The patient is unable to walk. Possible fall yesterday. Elevated white blood cell count. EXAM: CT PELVIS WITHOUT CONTRAST TECHNIQUE: Multidetector CT imaging of the pelvis was performed following the standard protocol  without intravenous contrast. COMPARISON:  None. FINDINGS: Urinary Tract:  Negative. Bowel:  Imaged bowel loops and the appendix appear normal. Vascular/Lymphatic: No pathologically enlarged lymph nodes. No significant vascular abnormality seen. Reproductive:  Negative. Other:  No intra-abdominal fluid collection. Musculoskeletal: Multifocal muscular edema is identified. Involved muscles include the right gluteus maximus and medius, left gluteus medius and minimus and left obturator externus. Left hip joint effusion is identified. Subcutaneous stranding is seen about the right buttock and left greater trochanter. No bony destructive change or periosteal reaction is identified. No avascular necrosis of the femoral heads. Small synovial herniation pit about both hips are noted. IMPRESSION: Multifocal muscle edema about the pelvis most most worrisome for infectious myositis in this patient with an elevated white blood cell count. Rhabdomyolysis is possible. Left hip joint effusion worrisome for septic joint given elevated white count. No evidence of osteomyelitis. Subcutaneous stranding about the left hip and right buttock likely due to cellulitis. Electronically Signed   By: Drusilla Kanner M.D.   On: 09/26/2016 11:17   Mr Brain Wo Contrast  Addendum Date: 09/27/2016   ADDENDUM REPORT: 09/27/2016 13:59 ADDENDUM: Acute findings discussed with and reconfirmed by Dr.Kirkpatrick, Neurology on 09/27/2016 at 1:45pm. Electronically Signed   By: Awilda Metro M.D.   On: 09/27/2016 13:59   Result Date: 09/27/2016 CLINICAL DATA:  Weakness and pain, follow-up abnormal temporal horns. Evaluate for meningitis. History of polysubstance abuse. EXAM: MRI HEAD WITHOUT CONTRAST TECHNIQUE: Multiplanar, multiecho pulse sequences of the brain and surrounding structures were obtained without intravenous contrast. COMPARISON:  CT HEAD September 26, 2016 FINDINGS: Multiple sequences are mildly or moderately motion degraded. INTRACRANIAL  CONTENTS: Symmetric reduced diffusion bilateral hippocampi with low ADC values and bright FLAIR T2 hyperintense signal. Subcentimeter foci of reduced diffusion bilateral basal ganglia, LEFT cerebellum. No intraventricular reduced diffusion. No abnormal susceptibility artifact to suggest hemorrhage. No midline shift, mass effect or masses. No abnormal extra-axial fluid collections. VASCULAR: Normal major intracranial vascular flow voids present at skull base. SKULL AND UPPER CERVICAL SPINE: No abnormal sellar expansion. No suspicious calvarial bone marrow signal. Craniocervical junction maintained. SINUSES/ORBITS: The mastoid air-cells and included paranasal sinuses are well-aerated.The included ocular globes and orbital contents are non-suspicious. OTHER: None. IMPRESSION: 1. 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 Findings less likely reflect herpes or limbic encephalitis. Additional satellite lacunar infarcts. 2. No imaging findings of meningitis by noncontrast MRI. Electronically Signed: By: Awilda Metro M.D. On: 09/27/2016 13:51   Mr Cervical Spine Wo Contrast  Result Date: 09/29/2016 CLINICAL DATA:  36 y/o M; history of polysubstance abuse presenting with inability to walk, lower back pain, left leg pain and numbness. EXAM: MRI CERVICAL SPINE WITHOUT CONTRAST TECHNIQUE: Multiplanar, multisequence MR imaging of the cervical spine was performed. No intravenous contrast was administered. COMPARISON:  09/26/2016 CT cervical spine. FINDINGS: Motion degraded study. Alignment: Straightening of cervical lordosis.  No listhesis. Vertebrae: No fracture, evidence of discitis, or bone lesion. Cord: Normal signal and morphology. Posterior Fossa, vertebral arteries, paraspinal tissues: Increased T2 signal within the hippocampi by a as seen on prior MRI of the brain. Disc levels: C2-3: No significant disc displacement, foraminal narrowing, or canal stenosis. C3-4: No  significant disc displacement, foraminal narrowing, or canal stenosis.  C4-5: No significant disc displacement, foraminal narrowing, or canal stenosis. C5-6: Right subarticular to foraminal small disc protrusion with mild-to-moderate right foraminal stenosis. C6-7: No significant disc displacement, foraminal narrowing, or canal stenosis. C7-T1: No significant disc displacement, foraminal narrowing, or canal stenosis. IMPRESSION: 1. No abnormal cord or bone marrow signal. 2. C5-6 right subarticular to foraminal disc protrusion with mild-to-moderate right foraminal stenosis. 3. No significant canal stenosis. Electronically Signed   By: Mitzi Hansen M.D.   On: 09/29/2016 19:55   Dg Chest Port 1 View  Result Date: 09/26/2016 CLINICAL DATA:  Pain. EXAM: PORTABLE CHEST 1 VIEW COMPARISON:  None. FINDINGS: Normal heart size and mediastinal contours. No acute infiltrate or edema. No effusion or pneumothorax. No acute osseous findings. IMPRESSION: Negative portable chest. Electronically Signed   By: Marnee Spring M.D.   On: 09/26/2016 09:25   Dg Fluoro Guided Needle Plc Aspiration/injection Loc  Result Date: 09/28/2016 CLINICAL DATA:  Bilateral lower extremity numbness. Left hip fluid noted on recent imaging. History of polysubstance abuse. EXAM: LEFT HIP ASPIRATION UNDER FLUOROSCOPY FLUOROSCOPY TIME:  Fluoroscopy Time:  30 seconds Number of Acquired Spot Images: 3 PROCEDURE: Informed consent was obtained from the patient's parents. The patient's mental status was altered and he was unable to give consent. Left common femoral vein was palpated. Left femoral head and neck region was identified with fluoroscopy. Skin was prepped with Betadine. Sterile field was created. Skin was anesthetized with 1% lidocaine. 20 gauge spinal needle was directed onto the lateral aspect of the left femoral head and neck junction with fluoroscopic guidance. 2 mL of yellow joint fluid was aspirated. Needle was removed without  complication. Bandage placed over the puncture site. IMPRESSION: Technically successful left hip aspiration with fluoroscopy. Fluid was sent for analysis. Electronically Signed   By: Richarda Overlie M.D.   On: 09/28/2016 07:49   US Abdomen Limited Ruq  Result Date: 09/26/2016 CLINICAL DATA:  Initial evaluation for elevated transaminitis. EXAM: ULTRASOUND ABDOMEN LIMITED RIGHT UPPER QUADRANT COMPARISON:  None available. FINDINGS: Gallbladder: Evaluation of the gallbladder somewhat limited due to overlying bowel gas and patient positioning. No definite echogenic stones or sludge identified. Gallbladder wall measure within normal limits a 2.3 mm. No free pericholecystic fluid. No sonographic Murphy sign elicited on exam. Common bile duct: Diameter: 2.9 mm Liver: No focal lesion identified. Within normal limits in parenchymal echogenicity. IMPRESSION: Grossly normal right upper quadrant ultrasound without cholelithiasis, evidence for acute cholecystitis, or biliary dilatation. Please note that evaluation of the gallbladder mildly limited due to shadowing from overlying bowel gas and patient positioning. Electronically Signed   By: Rise Mu M.D.   On: 09/26/2016 21:43    Time Spent in minutes  25   Eddie North M.D on 09/30/2016 at 11:20 AM  Between 7am to 7pm - Pager - [phone]  After 7pm go to [email or web address] - password [facility]  [facility] -  Office  [phone]

## 2016-09-30 NOTE — Progress Notes (Signed)
Noted MRI results. Given that hyperreflexia was the driving reason for spinal imaging and this included pectoral reflexes, I think that the cervical MRI is adequate and have canceled thoracic MRI. IT is possible that his basal ganglia changes were responsible for his hyperreflexia. At this point, I think that we have an explanation for his presentation in his overdose and care will be supportive from this point forward. Neurology will sign off, please call with further questions or concerns.   Ritta SlotMcNeill Chasen Mendell, MD [facility] [phone]  If 7pm- 7am, please page neurology on call as listed in AMION.

## 2016-10-01 DIAGNOSIS — R740 Nonspecific elevation of levels of transaminase and lactic acid dehydrogenase [LDH]: Secondary | ICD-10-CM

## 2016-10-01 DIAGNOSIS — F209 Schizophrenia, unspecified: Secondary | ICD-10-CM

## 2016-10-01 DIAGNOSIS — F319 Bipolar disorder, unspecified: Secondary | ICD-10-CM

## 2016-10-01 DIAGNOSIS — I6381 Other cerebral infarction due to occlusion or stenosis of small artery: Secondary | ICD-10-CM

## 2016-10-01 DIAGNOSIS — F1910 Other psychoactive substance abuse, uncomplicated: Secondary | ICD-10-CM

## 2016-10-01 DIAGNOSIS — M79605 Pain in left leg: Secondary | ICD-10-CM

## 2016-10-01 DIAGNOSIS — R930 Abnormal findings on diagnostic imaging of skull and head, not elsewhere classified: Secondary | ICD-10-CM

## 2016-10-01 DIAGNOSIS — D72829 Elevated white blood cell count, unspecified: Secondary | ICD-10-CM

## 2016-10-01 DIAGNOSIS — M9981 Other biomechanical lesions of cervical region: Secondary | ICD-10-CM

## 2016-10-01 DIAGNOSIS — M545 Low back pain: Secondary | ICD-10-CM

## 2016-10-01 DIAGNOSIS — I639 Cerebral infarction, unspecified: Principal | ICD-10-CM

## 2016-10-01 DIAGNOSIS — M609 Myositis, unspecified: Secondary | ICD-10-CM

## 2016-10-01 DIAGNOSIS — M79604 Pain in right leg: Secondary | ICD-10-CM

## 2016-10-01 DIAGNOSIS — M25552 Pain in left hip: Secondary | ICD-10-CM

## 2016-10-01 DIAGNOSIS — M4802 Spinal stenosis, cervical region: Secondary | ICD-10-CM

## 2016-10-01 DIAGNOSIS — E876 Hypokalemia: Secondary | ICD-10-CM

## 2016-10-01 LAB — COMPREHENSIVE METABOLIC PANEL
ALT: 140 U/L — ABNORMAL HIGH (ref 17–63)
ANION GAP: 4 — AB (ref 5–15)
AST: 239 U/L — ABNORMAL HIGH (ref 15–41)
Albumin: 3.1 g/dL — ABNORMAL LOW (ref 3.5–5.0)
Alkaline Phosphatase: 54 U/L (ref 38–126)
BILIRUBIN TOTAL: 0.9 mg/dL (ref 0.3–1.2)
BUN: 9 mg/dL (ref 6–20)
CHLORIDE: 107 mmol/L (ref 101–111)
CO2: 30 mmol/L (ref 22–32)
Calcium: 8.9 mg/dL (ref 8.9–10.3)
Creatinine, Ser: 0.84 mg/dL (ref 0.61–1.24)
GFR calc Af Amer: >60 mL/min (ref >60)
Glucose, Bld: 105 mg/dL — ABNORMAL HIGH (ref 65–99)
POTASSIUM: 3.5 mmol/L (ref 3.5–5.1)
Sodium: 141 mmol/L (ref 135–145)
TOTAL PROTEIN: 5.8 g/dL — AB (ref 6.5–8.1)

## 2016-10-01 LAB — CULTURE, BLOOD (ROUTINE X 2): Culture: NO GROWTH

## 2016-10-01 LAB — CK: CK TOTAL: 5055 U/L — AB (ref 49–397)

## 2016-10-01 NOTE — Clinical Social Work Note (Signed)
Clinical Social Work Assessment  Patient Details  Name: Phillip Mcdowell MRN: [id number] Date of Birth: March 05, 1980  Date of referral:  10/01/16               Reason for consult:  Facility Placement (CIR; SNF is backup)                Permission sought to share information with:  Family Supports Permission granted to share information::  Yes, Verbal Permission Granted  Name::      Orland Mustard  Agency::     Relationship::  Aunt   Contact Information:  [phone]   Housing/Transportation Living arrangements for the past 2 months:  [place] of Information:  Other (Comment Required) (other family at bedside) Patient Interpreter Needed:  None Criminal Activity/Legal Involvement Pertinent to Current Situation/Hospitalization:  No - Comment as needed Significant Relationships:  Other Family Members Lives with:  Relatives Do you feel safe going back to the place where you live?  Yes Need for family participation in patient care:  Yes (Comment)  Care giving concerns: Patient is from home with family. Family at bedside, aunt is main contact. Active substance use.   Social Worker assessment / plan: CSW met with patient and family at bedside. Patient oriented to self and place, disoriented to situation. Inpatient rehab doctor assessed patient while CSW at bedside. CIR to follow but undetermined if patient will admit there. Family wondering if they will be able to visit with him overnight at CIR to encourage him to continue with therapy. CSW indicated backup plan for CIR would be SNF and family certain patient will not agree to SNF, as patient has been wanting to leave hospital since admission. No referrals to SNF made. May need assessment for substance use and resources. CSW to continue to follow for disposition planning if CIR not appropriate or no bed available.  Employment status:  Unemployed Forensic scientist:  Self Pay (Medicaid Pending) PT Recommendations:  Inpatient Rehab  Consult Information / Referral to community resources:  Acute Rehab  Patient/Family's Response to care: Family appreciative of care, but concerned about patient leaving AMA.  Patient/Family's Understanding of and Emotional Response to Diagnosis, Current Treatment, and Prognosis: Family with understanding of patient's medical conditions though with questions about treatment. Family hopeful patient can rehab in [place] so he can return home safely.  Emotional Assessment Appearance:  Appears stated age Attitude/Demeanor/Rapport:  Other (oriented to self, confused about situation) Affect (typically observed):  Calm Orientation:  Oriented to Self, Oriented to Place Alcohol / Substance use:  Tobacco Use, Alcohol Use, Illicit Drugs Psych involvement (Current and /or in the community):  No (Comment)  Discharge Needs  Concerns to be addressed:  Discharge Planning Concerns Readmission within the last 30 days:  No Current discharge risk:  Physical Impairment Barriers to Discharge:  Continued Medical Work up   Estanislado Emms, LCSW 10/01/2016, 5:09 PM

## 2016-10-01 NOTE — Evaluation (Signed)
Clinical/Bedside Swallow Evaluation Patient Details  Name: Carle Fenech MRN: [id number] Date of Birth: [birth year]  Today's Date: 10/01/2016 Time: SLP Start Time (ACUTE ONLY): 1044 SLP Stop Time (ACUTE ONLY): 1103 SLP Time Calculation (min) (ACUTE ONLY): 19 min  Past Medical History:  Past Medical History:  Diagnosis Date  . Bipolar 1 disorder (HCC)   . Depression   . Family history of adverse reaction to anesthesia    "maternal grandmother "gets sick"  . History of gout   . Panic attack    Past Surgical History:  Past Surgical History:  Procedure Laterality Date  . INGUINAL HERNIA REPAIR Bilateral 1984  . LAPAROSCOPIC CHOLECYSTECTOMY  ~ 20160   HPI:  36 year old male with history of polysubstance abuse, presented to the emergency department with greater than 24 hour history of inability to walk, low back pain, leg numbness and leg pain. Initial evaluation revealed multiple abnormalities including abnormal appearance of the brain on CT concerning for meningitis or drug use, possible pancreatitis, left hip effusion, myositis of the pelvic muscles, elevated transaminases, elevated troponin, leukocytosis. 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 Findings less likely reflect herpes or limbic encephalitis. Additional satellite lacunar infarcts  Assessment / Plan / Recommendation Clinical Impression   Pt without overt s/s of aspiration noted during BSE with normal oropharyngeal swallow noted, but pt has a hx of GERD per pt/family report intermittently with regurgitation infrequently; pt exhibited decreased awareness of deficits and slight decreased lingual ROM/strength noted during BSE; needs SLE for cognitive deficits; Regular/thin liquids with ST  f/u for diet tolerance d/t cognitive deficits and SLE to be completed. SLP Visit Diagnosis: Dysphagia, unspecified (R13.10)    Aspiration Risk  Mild aspiration risk    Diet Recommendation   Regular/thin liquids  Medication Administration: Whole meds with liquid    Other  Recommendations Oral Care Recommendations: Oral care BID   Follow up Recommendations Inpatient Rehab      Frequency and Duration Other (Comment) (pending SLE)  2 weeks       Prognosis Prognosis for Safe Diet Advancement: Good Barriers to Reach Goals: Cognitive deficits      Swallow Study   General Date of Onset: 09/26/16 HPI: 36 year old male with history of polysubstance abuse, presented to the emergency department with greater than 24 hour history of inability to walk, low back pain, leg numbness and leg pain. Initial evaluation revealed multiple abnormalities including abnormal appearance of the brain on CT concerning for meningitis or drug use, possible pancreatitis, left hip effusion, myositis of the pelvic muscles, elevated transaminases, elevated troponin, leukocytosis. EDP discussed with radiology and neuro hospitalists at [facility], recommendations were to transfer to [facility] for further evaluation including specialist consultation, MRI of the brain Type of Study: Bedside Swallow Evaluation Diet Prior to this Study: Regular;Thin liquids Temperature Spikes Noted: No Respiratory Status: Room air Behavior/Cognition: Alert;Cooperative;Distractible Oral Cavity Assessment: Within Functional Limits Oral Care Completed by SLP: No Oral Cavity - Dentition: Adequate natural dentition Vision: Functional for self-feeding Self-Feeding Abilities: Able to feed self Patient Positioning: Upright in bed Baseline Vocal Quality: Normal Volitional Cough: Strong Volitional Swallow: Able to elicit    Oral/Motor/Sensory Function Overall Oral Motor/Sensory Function: Mild impairment Facial ROM: Within Functional  Limits Facial Symmetry: Within Functional Limits Facial Strength: Within Functional Limits Lingual ROM: Reduced right;Reduced  left Lingual Symmetry: Within Functional Limits Lingual Strength: Reduced Lingual Sensation: Within Functional Limits   Ice Chips Ice chips: Within functional limits Presentation: Spoon   Thin Liquid Thin Liquid: Within functional limits Presentation: Cup;Straw    Nectar Thick Nectar Thick Liquid: Not tested   Honey Thick Honey Thick Liquid: Not tested   Puree Puree: Within functional limits Presentation: Self Fed   Solid      Solid: Within functional limits Presentation: Self Fed    Functional Assessment Tool Used: NOMS; clinical judgment Functional Limitations: Swallowing Swallow Current Status ([id number]): At least 1 percent but less than 20 percent impaired, limited or restricted Swallow Goal Status [phone]): At least 1 percent but less than 20 percent impaired, limited or restricted   Tressie StalkerPat Duff Pozzi, M.S., CCC-SLP 10/01/2016,12:13 PM

## 2016-10-01 NOTE — Evaluation (Signed)
Physical Therapy Evaluation Patient Details Name: Phillip Mcdowell MRN: [id number] DOB: Oct 03, 1980 Today's Date: 10/01/2016   History of Present Illness  Pt is 36 y.o. male admitted to ED with polysubstance abuse (alcohol, injected heroin with fentanyl) on 09/26/16 with inability to walk, low back pain, left leg pain and numbness. Pelvic CT shows muscle edema worrisome for infectious myositis; rhabdomyolysis possible. MRI 8/6 shows bilat hippocampi infarc assoc with durg overdose, and additional satellite lacunar infarcts. 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 Pertinent PMH includes bipolar, depression.  Clinical Impression  Pt presents to PT with RLE weakness/impaired sensation, impaired memory, decreased safety awareness, and an overall decrease in functional mobility secondary to above. PTA, pt indep and living at home with children; family reports he will have 24/7 support if needed upon d/c. Pt demonstrates impulsiveness and significant decrease in awareness of deficits throughout session, requiring multiple cues for safety and gait mechanics. Amb with RW and minA with multimodal cues. Decrease in gait speed indicates increased risk for falls. Pt would benefit from continued acute PT services with CIR level follow-up therapy to maximize functional mobility and independence prior to d/c home.     Follow Up Recommendations CIR;Supervision/Assistance - 24 hour    Equipment Recommendations  Rolling walker with 5" wheels    Recommendations for Other Services Rehab consult     Precautions / Restrictions Precautions Precautions: Fall Restrictions Weight Bearing Restrictions: No      Mobility  Bed Mobility Overal bed mobility: Needs Assistance Bed Mobility: Supine to Sit     Supine to sit: Min guard     General bed mobility comments: Min guard for safety, and for technique with IV line as pt impulsive with  movement  Transfers Overall transfer level: Needs assistance Equipment used: Rolling walker (2 wheeled) Transfers: Sit to/from Stand Sit to Stand: Min guard         General transfer comment: Min guard for balance and technique with RW; pt impulsive with movement requiring repeated cues  Ambulation/Gait Ambulation/Gait assistance: Min assist Ambulation Distance (Feet): 150 Feet Assistive device: Rolling walker (2 wheeled) Gait Pattern/deviations: Step-to pattern;Step-through pattern;Decreased step length - left;Decreased weight shift to right;Decreased dorsiflexion - right;Trendelenburg;Steppage Gait velocity: Decreased Gait velocity interpretation: <1.8 ft/sec, indicative of risk for recurrent falls General Gait Details: Amb with RW and minA for balance and cues for maneuvering RLE; pt reports numbness in R foot, but able to clear foot to take step with cues. Verbal/tactile cues to initiate R hip extension and step-through pattern. Cues for technique with RW. Increased WOB with amb.  Stairs            Wheelchair Mobility    Modified Rankin (Stroke Patients Only) Modified Rankin (Stroke Patients Only) Pre-Morbid Rankin Score: No symptoms Modified Rankin: Moderately severe disability     Balance Overall balance assessment: Needs assistance Sitting-balance support: No upper extremity supported;Feet supported Sitting balance-Leahy Scale: Fair     Standing balance support: Bilateral upper extremity supported;During functional activity Standing balance-Leahy Scale: Poor Standing balance comment: Reliant on BUE support secondary to pain and numbness in RLE                             Pertinent Vitals/Pain Pain Assessment: 0-10 Pain Score: 5  Pain Location: Legs and arms Pain Descriptors / Indicators: Aching Pain Intervention(s): Limited activity within patient's tolerance;Monitored during session    Home Living Family/patient  expects to be discharged to::  Private residence Living Arrangements: Other relatives Database administrator) Available Help at Discharge: Family;Friend(s);Available 24 hours/day Type of Home: Mobile home Home Access: Stairs to enter Entrance Stairs-Rails: Can reach both Entrance Stairs-Number of Steps: 2 Home Layout: One level Home Equipment: None Additional Comments: Worried a RW will not fit in mobile home    Prior Function Level of Independence: Independent         Comments: Aunt reports multiple falls the past couple weeks, but pt does not recall this     Hand Dominance        Extremity/Trunk Assessment   Upper Extremity Assessment Upper Extremity Assessment: Overall WFL for tasks assessed    Lower Extremity Assessment Lower Extremity Assessment: RLE deficits/detail RLE Deficits / Details: RLE hip and knee flex/ext 4/5, ankle DF 0/5, PF 2/5. Decreased sensation in distale RLE below knee  RLE Sensation: decreased light touch       Communication   Communication: No difficulties  Cognition Arousal/Alertness: Awake/alert Behavior During Therapy: Impulsive Overall Cognitive Status: Impaired/Different from baseline Area of Impairment: Orientation;Attention;Memory;Following commands;Safety/judgement;Awareness;Problem solving                 Orientation Level: Disoriented to;Time;Situation Current Attention Level: Sustained Memory: Decreased short-term memory Following Commands: Follows multi-step commands inconsistently Safety/Judgement: Decreased awareness of safety;Decreased awareness of deficits Awareness: Emergent Problem Solving: Requires verbal cues;Requires tactile cues General Comments: Oriented to his name, names of family/friends in room, and [facility]; able to guess date and year with multiple cues, reorientated to date and situation. Able to recall home set-up correctly, as verified by aunt. Significant decrease in short term memory and safety awareness. Required multiple cues for  safety with transfers and ambulation. Adament about returning home to "get back to normal life" despite explaining recs for further rehab.      General Comments      Exercises     Assessment/Plan    PT Assessment Patient needs continued PT services  PT Problem List Decreased strength;Decreased mobility;Decreased safety awareness;Decreased range of motion;Decreased coordination;Decreased activity tolerance;Decreased cognition;Decreased balance;Decreased knowledge of use of DME;Impaired sensation;Pain       PT Treatment Interventions DME instruction;Gait training;Stair training;Functional mobility training;Therapeutic activities;Therapeutic exercise;Balance training;Neuromuscular re-education;Cognitive remediation;Patient/family education    PT Goals (Current goals can be found in the Care Plan section)  Acute Rehab PT Goals Patient Stated Goal: Return home PT Goal Formulation: With patient Time For Goal Achievement: 10/15/16 Potential to Achieve Goals: Good    Frequency Min 4X/week   Barriers to discharge Inaccessible home environment      Co-evaluation               AM-PAC PT "6 Clicks" Daily Activity  Outcome Measure Difficulty turning over in bed (including adjusting bedclothes, sheets and blankets)?: None Difficulty moving from lying on back to sitting on the side of the bed? : Total Difficulty sitting down on and standing up from a chair with arms (e.g., wheelchair, bedside commode, etc,.)?: Total Help needed moving to and from a bed to chair (including a wheelchair)?: A Little Help needed walking in hospital room?: A Little Help needed climbing 3-5 steps with a railing? : A Little 6 Click Score: 15    End of Session Equipment Utilized During Treatment: Gait belt Activity Tolerance: Patient tolerated treatment well Patient left: in chair;with call bell/phone within reach;with family/visitor present Nurse Communication: Mobility status PT Visit Diagnosis:  Unsteadiness on feet (R26.81);Muscle weakness (generalized) (M62.81);Other symptoms and signs involving the  nervous system (R29.898)    Time: 1140-1205 PT Time Calculation (min) (ACUTE ONLY): 25 min   Charges:   PT Evaluation $PT Eval Moderate Complexity: 1 Mod PT Treatments $Gait Training: 8-22 mins   PT G Codes:       Ina HomesJaclyn Alizabeth Antonio, PT, DPT Acute Rehab  Malachy ChamberJaclyn L Nijee Heatwole 10/01/2016, 12:51 PM

## 2016-10-01 NOTE — Progress Notes (Signed)
PROGRESS NOTE                                                                                                                                                                                                             Phillip Mcdowell Demographics:    Phillip Mcdowell, is a 36 y.o. male, DOB - 1980-12-25, ZOX:[id number]  Admit date - 09/26/2016   Admitting Physician Standley Brooking, MD  Outpatient Primary MD for the Phillip Mcdowell is Phillip Mcdowell, No Pcp Per  LOS - 5  Outpatient Specialists:None  Chief Complaint  Phillip Mcdowell presents with  . Back Pain       Brief Narrative  36 year old male with polysubstance abuse (alcohol, injected heroin with fentanyl) presented to the ED with inability to walk, low back pain, left leg pain and numbness. As per his mother Phillip Mcdowell has had frequent falls in the past 2 weeks. In the ED workup showed multiple abnormalities including leukocytosis, elevated LFTs, rhabdomyolysis. CT of the head showed abnormal appearance of the temporal horns of the lateral ventricles uncertain for meningitis versus drug use. Phillip Mcdowell was transferred to [facility] for further evaluation.    Subjective:   No reported agitation during the night. Phillip Mcdowell seems better oriented today (place and person, confused with the month). However when I saw him again after he did have more confusion. Improved lower extremity weakness. No further pain.   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 Also shows satellite lacunar infarcts. Hyperreflexia on exam is likely due to basal ganglia changes. MRI of the cervical spine without abnormal cord or bone marrow signal. Shows C5-C6 foraminal disc protrusion with mild to  moderate foraminal stenosis.  Seen by PT and recommend CIR given his poor awareness and risk of fall. Seen by SLP for cognitive deficits.  Acute encephalopathy Possibly secondary to hippocampal infarct. Better oriented today suspect he will have permanent residuals cognitive issues.  EEG with diffuse nonspecific cerebral dysfunction . Neurology consult appreciated.    Left hip joint effusion Suspect inflammatory from synovial fluid analysis. Cultures negative and negative for crystals. No pain or tenderness on hip movement. Discontinued empiric antibiotics.  Acute transaminitis secondary  to rhabdomyolysis.  Both LFTs and CPK slowly improving. Continue hydration. Avoid hepatotoxic agents or use with caution.  Hypokalemia Replenished.   Elevated troponin Secondary to rhabdomyolysis. No chest pain or significant EKG changes.  HCV antibody positive Needs outpatient follow-up.  Possible pancreatitis Elevated lipase with mild retroperitoneal edema and pancreatic thickening. Lipase trended down. No abdominal pain or tenderness. Has active alcohol use.  Dehydration and hyponatremia Improved with IV fluids  Polysubstance use Daily alcohol and marijuana use. Urine toxin positive for amphetamines, opiates and benzodiazepines. Prior IV heroin use. On CIWA  Agitation Seroquel at bedtime which seems to have helped his symptoms.. Monitor on CIWA.  Code Status : Full code  Family Communication  : Discussed with aunt on the phone  Disposition Plan  : CIR consult  Barriers For Discharge : Improving symptoms, CIR versus SNF upon discharge  Consults  :  Neurology IR  Procedures  : CT head/cervical spine MRI brain EEG 2-D echo Hip aspiration  DVT Prophylaxis  :  Lovenox -  Lab Results  Component Value Date   PLT 262 09/28/2016    Antibiotics  :    Anti-infectives    Start     Dose/Rate Route Frequency Ordered Stop   09/27/16 0000  cefTRIAXone (ROCEPHIN) 2 g in dextrose  5 % 50 mL IVPB  Status:  Discontinued     2 g 100 mL/hr over 30 Minutes Intravenous Every 12 hours 09/26/16 1716 09/28/16 1345   09/26/16 2000  vancomycin (VANCOCIN) IVPB 1000 mg/200 mL premix  Status:  Discontinued     1,000 mg 200 mL/hr over 60 Minutes Intravenous Every 8 hours 09/26/16 1724 09/28/16 1345   09/26/16 1130  cefTRIAXone (ROCEPHIN) 2 g in dextrose 5 % 50 mL IVPB     2 g 100 mL/hr over 30 Minutes Intravenous  Once 09/26/16 1117 09/26/16 1157   09/26/16 1130  vancomycin (VANCOCIN) IVPB 1000 mg/200 mL premix     1,000 mg 200 mL/hr over 60 Minutes Intravenous  Once 09/26/16 1117 09/26/16 1234   09/26/16 1130  acyclovir (ZOVIRAX) 815 mg in dextrose 5 % 150 mL IVPB     10 mg/kg  81.6 kg 166.3 mL/hr over 60 Minutes Intravenous  Once 09/26/16 1119 09/26/16 1355        Objective:   Vitals:   09/30/16 0006 09/30/16 1807 09/30/16 2122 10/01/16 0540  BP: 134/72 (!) 143/69 132/66 136/73  Pulse: 88 76 65 64  Resp:   19 16  Temp:  97.7 F (36.5 C) 98.4 F (36.9 C) (!) 97.5 F (36.4 C)  TempSrc:  Oral Oral Axillary  SpO2:  100% 100% 100%  Weight:      Height:        Wt Readings from Last 3 Encounters:  09/26/16 81.6 kg (180 lb)  05/01/15 81.6 kg (180 lb)  09/07/14 83.9 kg (185 lb)     Intake/Output Summary (Last 24 hours) at 10/01/16 1344 Last data filed at 09/30/16 2122  Gross per 24 hour  Intake              480 ml  Output              500 ml  Net              -20 ml    Physical exam Gen.: Middle aged male not in distress, appears more oriented and pleasant today HEENT: Moist mucosa, supple Chest: Clear bilaterally CVS: Normal S1 and S2, no murmurs  GI: Soft, nontender, nondistended Musculoskeletal: Warm, no edema CNS: Alert and oriented 2-3, improved bilateral lower extremity strength and decreased hyperreflexia      Data Review:    CBC  Recent Labs Lab 09/26/16 0924 09/27/16 0511 09/28/16 1106  WBC 18.5* 24.3* 11.2*  HGB 18.1* 18.2*  14.7  HCT 50.5 49.9 42.1  PLT 185 327 262  MCV 94.7 92.9 93.6  MCH 34.0 33.9 32.7  MCHC 35.8 36.5* 34.9  RDW 13.6 13.6 13.6  LYMPHSABS 1.3 1.3  --   MONOABS 1.5* 2.1*  --   EOSABS 0.2 0.0  --   BASOSABS 0.0 0.0  --     Chemistries   Recent Labs Lab 09/26/16 0924 09/27/16 0511 09/28/16 1106 09/30/16 0522 10/01/16 0324  NA 128* 134* 134* 140 141  K 4.0 4.9 3.9 2.8* 3.5  CL 99* 100* 103 106 107  CO2 21* 23 23 24 30   GLUCOSE 149* 132* 99 148* 105*  BUN 12 11 15 9 9   CREATININE 0.97 1.15 1.00 0.91 0.84  CALCIUM 8.6* 9.2 8.5* 8.6* 8.9  MG  --   --   --  1.7  --   AST 719* 645* 360* 354* 239*  ALT 231* 236* 152* 148* 140*  ALKPHOS 93 87 55 43 54  BILITOT 1.6* 1.0 0.9 0.9 0.9   ------------------------------------------------------------------------------------------------------------------ No results for input(s): CHOL, HDL, LDLCALC, TRIG, CHOLHDL, LDLDIRECT in the last 72 hours.  No results found for: HGBA1C ------------------------------------------------------------------------------------------------------------------ No results for input(s): TSH, T4TOTAL, T3FREE, THYROIDAB in the last 72 hours.  Invalid input(s): FREET3 ------------------------------------------------------------------------------------------------------------------ No results for input(s): VITAMINB12, FOLATE, FERRITIN, TIBC, IRON, RETICCTPCT in the last 72 hours.  Coagulation profile No results for input(s): INR, PROTIME in the last 168 hours.  No results for input(s): DDIMER in the last 72 hours.  Cardiac Enzymes  Recent Labs Lab 09/26/16 0924 09/26/16 1545  TROPONINI 0.58* 0.57*   ------------------------------------------------------------------------------------------------------------------ No results found for: BNP  Inpatient Medications  Scheduled Meds: . folic acid  1 mg Oral Daily  . multivitamin with minerals  1 tablet Oral Daily  . nicotine  21 mg Transdermal Daily  .  QUEtiapine  50 mg Oral QHS  . thiamine  100 mg Oral Daily   Continuous Infusions: . sodium chloride 100 mL/hr at 09/28/16 0842   PRN Meds:.acetaminophen **OR** acetaminophen, LORazepam **OR** LORazepam, ondansetron **OR** ondansetron (ZOFRAN) IV  Micro Results Recent Results (from the past 240 hour(s))  Blood culture (routine x 2)     Status: None   Collection Time: 09/26/16  9:25 AM  Result Value Ref Range Status   Specimen Description   Final    BLOOD BLOOD RIGHT WRIST BOTTLES DRAWN AEROBIC AND ANAEROBIC   Special Requests   Final    Blood Culture results may not be optimal due to an inadequate volume of blood received in culture bottles   Culture NO GROWTH 5 DAYS  Final   Report Status 10/01/2016 FINAL  Final  Anaerobic culture     Status: None (Preliminary result)   Collection Time: 09/26/16  7:15 PM  Result Value Ref Range Status   Specimen Description SYNOVIAL FLUID LEFT HIP  Final   Special Requests NONE  Final   Culture   Final    NO ANAEROBES ISOLATED; CULTURE IN PROGRESS FOR 5 DAYS   Report Status PENDING  Incomplete  Body fluid culture     Status: None   Collection Time: 09/26/16  7:15 PM  Result Value Ref Range  Status   Specimen Description SYNOVIAL FLUID LEFT HIP  Final   Special Requests NONE  Final   Gram Stain   Final    MODERATE WBC PRESENT, PREDOMINANTLY PMN NO ORGANISMS SEEN    Culture NO GROWTH 3 DAYS  Final   Report Status 09/30/2016 FINAL  Final    Radiology Reports Ct Head Wo Contrast  Result Date: 09/27/2016 CLINICAL DATA:  36 y/o  M; substance abuse and abnormal MRI. EXAM: CT HEAD WITHOUT CONTRAST TECHNIQUE: Contiguous axial images were obtained from the base of the skull through the vertex without intravenous contrast. COMPARISON:  09/27/2016 MRI head. FINDINGS: Brain: Hypoattenuation and mild mass effect of the hippocampi by corresponding to reduced diffusion on prior MRI. Punctate foci of reduced diffusion on MRI are not visible on CT. No evidence  for new acute intracranial hemorrhage, infarct, focal mass effect, hydrocephalus, or extra-axial collection. Vascular: No hyperdense vessel or unexpected calcification. Skull: Normal. Negative for fracture or focal lesion. Sinuses/Orbits: No acute finding. Other: None. IMPRESSION: 1. Hypoattenuation and mild mass effect of the hippocampi corresponding to diffusion abnormality on MRI. 2. No evidence for new acute intracranial hemorrhage, acute infarct, or focal mass effect. Electronically Signed   By: Mitzi Hansen M.D.   On: 09/27/2016 21:40   Ct Head Wo Contrast  Result Date: 09/26/2016 CLINICAL DATA:  Numbness in legs.  Found down. EXAM: CT HEAD WITHOUT CONTRAST CT CERVICAL SPINE WITHOUT CONTRAST TECHNIQUE: Multidetector CT imaging of the head and cervical spine was performed following the standard protocol without intravenous contrast. Multiplanar CT image reconstructions of the cervical spine were also generated. COMPARISON:  None. FINDINGS: CT HEAD FINDINGS Brain: Ventricles are normal in size and configuration. However, temporal horns of the lateral ventricles have an abnormal appearance bilaterally, perhaps indicating adjacent hippocampal edema and/or debris related to underlying meningitis. Remainder of the brain demonstrate normal gray-white matter attenuation. No parenchymal or extra-axial hemorrhage. Vascular: No hyperdense vessel or unexpected calcification. Skull: Normal. Negative for fracture or focal lesion. Sinuses/Orbits: No acute finding. Other: None. CT CERVICAL SPINE FINDINGS Alignment: Dextroscoliosis. No evidence of acute vertebral body subluxation. Skull base and vertebrae: No fracture line or displaced fracture fragment identified. Facet joints appear intact and normal in alignment. Soft tissues and spinal canal: No prevertebral fluid or swelling. No visible canal hematoma. Disc levels: Disc spaces are well preserved throughout. No significant central canal stenosis at any  level. Upper chest: Mild emphysematous change at the lung apices. No acute findings. Other: None. IMPRESSION: 1. Temporal horns of the lateral ventricles have an abnormal appearance bilaterally, perhaps indicating adjacent hippocampal edema and/or debris related to underlying meningitis. This appearance can also be related to drug use. Recommend brain MRI with contrast for further characterization. 2. No fracture or acute subluxation within the cervical spine. Scoliosis. 3. Emphysema. These results were called by telephone at the time of interpretation on 09/26/2016 at 11:11 am to Dr. Margarita Grizzle , who verbally acknowledged these results. Electronically Signed   By: Bary Richard M.D.   On: 09/26/2016 11:12   Ct Cervical Spine Wo Contrast  Result Date: 09/26/2016 CLINICAL DATA:  Numbness in legs.  Found down. EXAM: CT HEAD WITHOUT CONTRAST CT CERVICAL SPINE WITHOUT CONTRAST TECHNIQUE: Multidetector CT imaging of the head and cervical spine was performed following the standard protocol without intravenous contrast. Multiplanar CT image reconstructions of the cervical spine were also generated. COMPARISON:  None. FINDINGS: CT HEAD FINDINGS Brain: Ventricles are normal in size and configuration. However, temporal horns  of the lateral ventricles have an abnormal appearance bilaterally, perhaps indicating adjacent hippocampal edema and/or debris related to underlying meningitis. Remainder of the brain demonstrate normal gray-white matter attenuation. No parenchymal or extra-axial hemorrhage. Vascular: No hyperdense vessel or unexpected calcification. Skull: Normal. Negative for fracture or focal lesion. Sinuses/Orbits: No acute finding. Other: None. CT CERVICAL SPINE FINDINGS Alignment: Dextroscoliosis. No evidence of acute vertebral body subluxation. Skull base and vertebrae: No fracture line or displaced fracture fragment identified. Facet joints appear intact and normal in alignment. Soft tissues and spinal canal:  No prevertebral fluid or swelling. No visible canal hematoma. Disc levels: Disc spaces are well preserved throughout. No significant central canal stenosis at any level. Upper chest: Mild emphysematous change at the lung apices. No acute findings. Other: None. IMPRESSION: 1. Temporal horns of the lateral ventricles have an abnormal appearance bilaterally, perhaps indicating adjacent hippocampal edema and/or debris related to underlying meningitis. This appearance can also be related to drug use. Recommend brain MRI with contrast for further characterization. 2. No fracture or acute subluxation within the cervical spine. Scoliosis. 3. Emphysema. These results were called by telephone at the time of interpretation on 09/26/2016 at 11:11 am to Dr. Margarita Grizzle , who verbally acknowledged these results. Electronically Signed   By: Bary Richard M.D.   On: 09/26/2016 11:12   Ct Thoracic Spine Wo Contrast  Result Date: 09/26/2016 CLINICAL DATA:  Suspected fall yesterday.  Limited history. EXAM: CT THORACIC AND LUMBAR SPINE WITHOUT CONTRAST TECHNIQUE: Multidetector CT imaging of the thoracic and lumbar spine was performed without contrast. Multiplanar CT image reconstructions were also generated. COMPARISON:  None. FINDINGS: CT THORACIC SPINE FINDINGS Alignment: Mild exaggerated kyphosis.  No traumatic malalignment. Vertebrae: Negative for fracture or bone lesion. Paraspinal and other soft tissues: No acute finding. 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 Disc levels: No evidence of degenerative impingement. Ligamentum flavum thickening in ossification at T4-5 and T5-6. CT LUMBAR SPINE FINDINGS Segmentation: 5 lumbar type vertebrae. Alignment: Normal. Vertebrae: No acute fracture or focal pathologic process. Paraspinal and other soft tissues: Mild retroperitoneal edema centered in the central and left upper quadrant. The pancreatic tail appears somewhat thickened and indistinct diffusely. Disc levels: No significant  degenerative changes or evidence of impingement. IMPRESSION: CT THORACIC SPINE IMPRESSION No evidence of injury. CT LUMBAR SPINE IMPRESSION 1. No evidence of lumbar spine injury. 2. Mild retroperitoneal edema and pancreatic tail thickening, please correlate for pancreatitis by exam and labs. Electronically Signed   By: Marnee Spring M.D.   On: 09/26/2016 10:58   Ct Lumbar Spine Wo Contrast  Result Date: 09/26/2016 CLINICAL DATA:  Suspected fall yesterday.  Limited history. EXAM: CT THORACIC AND LUMBAR SPINE WITHOUT CONTRAST TECHNIQUE: Multidetector CT imaging of the thoracic and lumbar spine was performed without contrast. Multiplanar CT image reconstructions were also generated. COMPARISON:  None. FINDINGS: CT THORACIC SPINE FINDINGS Alignment: Mild exaggerated kyphosis.  No traumatic malalignment. Vertebrae: Negative for fracture or bone lesion. Paraspinal and other soft tissues: No acute finding. 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 Disc levels: No evidence of degenerative impingement. Ligamentum flavum thickening in ossification at T4-5 and T5-6. CT LUMBAR SPINE FINDINGS Segmentation: 5 lumbar type vertebrae. Alignment: Normal. Vertebrae: No acute fracture or focal pathologic process. Paraspinal and other soft tissues: Mild retroperitoneal edema centered in the central and left upper quadrant. The pancreatic tail appears somewhat thickened and indistinct diffusely. Disc levels: No significant degenerative changes or evidence of impingement. IMPRESSION: CT THORACIC SPINE IMPRESSION No evidence of injury. CT  LUMBAR SPINE IMPRESSION 1. No evidence of lumbar spine injury. 2. Mild retroperitoneal edema and pancreatic tail thickening, please correlate for pancreatitis by exam and labs. Electronically Signed   By: Marnee Spring M.D.   On: 09/26/2016 10:58   Ct Pelvis Wo Contrast  Result Date: 09/26/2016 CLINICAL DATA:  Bilateral lower extremity numbness. The Phillip Mcdowell is unable to walk. Possible fall  yesterday. Elevated white blood cell count. EXAM: CT PELVIS WITHOUT CONTRAST TECHNIQUE: Multidetector CT imaging of the pelvis was performed following the standard protocol without intravenous contrast. COMPARISON:  None. FINDINGS: Urinary Tract:  Negative. Bowel:  Imaged bowel loops and the appendix appear normal. Vascular/Lymphatic: No pathologically enlarged lymph nodes. No significant vascular abnormality seen. Reproductive:  Negative. Other:  No intra-abdominal fluid collection. Musculoskeletal: Multifocal muscular edema is identified. Involved muscles include the right gluteus maximus and medius, left gluteus medius and minimus and left obturator externus. Left hip joint effusion is identified. Subcutaneous stranding is seen about the right buttock and left greater trochanter. No bony destructive change or periosteal reaction is identified. No avascular necrosis of the femoral heads. Small synovial herniation pit about both hips are noted. IMPRESSION: Multifocal muscle edema about the pelvis most most worrisome for infectious myositis in this Phillip Mcdowell with an elevated white blood cell count. Rhabdomyolysis is possible. Left hip joint effusion worrisome for septic joint given elevated white count. No evidence of osteomyelitis. Subcutaneous stranding about the left hip and right buttock likely due to cellulitis. Electronically Signed   By: Drusilla Kanner M.D.   On: 09/26/2016 11:17   Mr Brain Wo Contrast  Addendum Date: 09/27/2016   ADDENDUM REPORT: 09/27/2016 13:59 ADDENDUM: Acute findings discussed with and reconfirmed by Dr.Kirkpatrick, Neurology on 09/27/2016 at 1:45pm. Electronically Signed   By: Awilda Metro M.D.   On: 09/27/2016 13:59   Result Date: 09/27/2016 CLINICAL DATA:  Weakness and pain, follow-up abnormal temporal horns. Evaluate for meningitis. History of polysubstance abuse. EXAM: MRI HEAD WITHOUT CONTRAST TECHNIQUE: Multiplanar, multiecho pulse sequences of the brain and surrounding  structures were obtained without intravenous contrast. COMPARISON:  CT HEAD September 26, 2016 FINDINGS: Multiple sequences are mildly or moderately motion degraded. INTRACRANIAL CONTENTS: Symmetric reduced diffusion bilateral hippocampi with low ADC values and bright FLAIR T2 hyperintense signal. Subcentimeter foci of reduced diffusion bilateral basal ganglia, LEFT cerebellum. No intraventricular reduced diffusion. No abnormal susceptibility artifact to suggest hemorrhage. No midline shift, mass effect or masses. No abnormal extra-axial fluid collections. VASCULAR: Normal major intracranial vascular flow voids present at skull base. SKULL AND UPPER CERVICAL SPINE: No abnormal sellar expansion. No suspicious calvarial bone marrow signal. Craniocervical junction maintained. SINUSES/ORBITS: The mastoid air-cells and included paranasal sinuses are well-aerated.The included ocular globes and orbital contents are non-suspicious. OTHER: None. IMPRESSION: 1. 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 Findings less likely reflect herpes or limbic encephalitis. Additional satellite lacunar infarcts. 2. No imaging findings of meningitis by noncontrast MRI. Electronically Signed: By: Awilda Metro M.D. On: 09/27/2016 13:51   Mr Cervical Spine Wo Contrast  Result Date: 09/29/2016 CLINICAL DATA:  36 y/o M; history of polysubstance abuse presenting with inability to walk, lower back pain, left leg pain and numbness. EXAM: MRI CERVICAL SPINE WITHOUT CONTRAST TECHNIQUE: Multiplanar, multisequence MR imaging of the cervical spine was performed. No intravenous contrast was administered. COMPARISON:  09/26/2016 CT cervical spine. FINDINGS: Motion degraded study. Alignment: Straightening of cervical lordosis.  No listhesis. Vertebrae: No fracture, evidence of discitis, or bone lesion. Cord: Normal signal and morphology.  Posterior Fossa, vertebral arteries, paraspinal tissues: Increased T2  signal within the hippocampi by a as seen on prior MRI of the brain. Disc levels: C2-3: No significant disc displacement, foraminal narrowing, or canal stenosis. C3-4: No significant disc displacement, foraminal narrowing, or canal stenosis. C4-5: No significant disc displacement, foraminal narrowing, or canal stenosis. C5-6: Right subarticular to foraminal small disc protrusion with mild-to-moderate right foraminal stenosis. C6-7: No significant disc displacement, foraminal narrowing, or canal stenosis. C7-T1: No significant disc displacement, foraminal narrowing, or canal stenosis. IMPRESSION: 1. No abnormal cord or bone marrow signal. 2. C5-6 right subarticular to foraminal disc protrusion with mild-to-moderate right foraminal stenosis. 3. No significant canal stenosis. Electronically Signed   By: Mitzi Hansen M.D.   On: 09/29/2016 19:55   Dg Chest Port 1 View  Result Date: 09/26/2016 CLINICAL DATA:  Pain. EXAM: PORTABLE CHEST 1 VIEW COMPARISON:  None. FINDINGS: Normal heart size and mediastinal contours. No acute infiltrate or edema. No effusion or pneumothorax. No acute osseous findings. IMPRESSION: Negative portable chest. Electronically Signed   By: Marnee Spring M.D.   On: 09/26/2016 09:25   Dg Fluoro Guided Needle Plc Aspiration/injection Loc  Result Date: 09/28/2016 CLINICAL DATA:  Bilateral lower extremity numbness. Left hip fluid noted on recent imaging. History of polysubstance abuse. EXAM: LEFT HIP ASPIRATION UNDER FLUOROSCOPY FLUOROSCOPY TIME:  Fluoroscopy Time:  30 seconds Number of Acquired Spot Images: 3 PROCEDURE: Informed consent was obtained from the Phillip Mcdowell's parents. The Phillip Mcdowell's mental status was altered and he was unable to give consent. Left common femoral vein was palpated. Left femoral head and neck region was identified with fluoroscopy. Skin was prepped with Betadine. Sterile field was created. Skin was anesthetized with 1% lidocaine. 20 gauge spinal needle was  directed onto the lateral aspect of the left femoral head and neck junction with fluoroscopic guidance. 2 mL of yellow joint fluid was aspirated. Needle was removed without complication. Bandage placed over the puncture site. IMPRESSION: Technically successful left hip aspiration with fluoroscopy. Fluid was sent for analysis. Electronically Signed   By: Richarda Overlie M.D.   On: 09/28/2016 07:49   US Abdomen Limited Ruq  Result Date: 09/26/2016 CLINICAL DATA:  Initial evaluation for elevated transaminitis. EXAM: ULTRASOUND ABDOMEN LIMITED RIGHT UPPER QUADRANT COMPARISON:  None available. FINDINGS: Gallbladder: Evaluation of the gallbladder somewhat limited due to overlying bowel gas and Phillip Mcdowell positioning. No definite echogenic stones or sludge identified. Gallbladder wall measure within normal limits a 2.3 mm. No free pericholecystic fluid. No sonographic Murphy sign elicited on exam. Common bile duct: Diameter: 2.9 mm Liver: No focal lesion identified. Within normal limits in parenchymal echogenicity. IMPRESSION: Grossly normal right upper quadrant ultrasound without cholelithiasis, evidence for acute cholecystitis, or biliary dilatation. Please note that evaluation of the gallbladder mildly limited due to shadowing from overlying bowel gas and Phillip Mcdowell positioning. Electronically Signed   By: Rise Mu M.D.   On: 09/26/2016 21:43    Time Spent in minutes  25   Eddie North M.D on 10/01/2016 at 1:44 PM  Between 7am to 7pm - Pager - [phone]  After 7pm go to [email or web address] - password [facility]  [facility] -  Office  [phone]

## 2016-10-01 NOTE — Consult Note (Signed)
Physical Medicine and Rehabilitation Consult  Reason for Consult:  Gait deficits  due to rhabdomyolysis and encephalopathy Referring Physician: Dr. Gonzella Lex   HPI: Phillip Mcdowell is a 36 y.o. male with history of bipolar disorder, schizophrenia,  polysubstance abuse who was admitted to [facility] after being found on the floor with inability to move BLE, BLE numbness and lethargy. History taken from chart review and family.  Patient with reports falls in the past few weeks.  CT head reviewed.  Per report, concerns of meningitis and he was transferred to [facility] for evaluation and treatment.  Work up revealed leucocytosis with multifocal muscle edema around pelvis concerning for myositis, left hip effusion concerning for septic joint and subcutaneous stranding left hip and right buttock likely due to cellulitis. Dr. August Saucer consulted for input and recommended aspiration of hip and MRI of hip for evaluation. Joint aspirated and cultures negative therefore empiric antibiotics discontinued.  MRI brain done showing " Symmetric hippocampi infarct associated with drug overdose specifically fentanyl and additional lacunar infarcts". EEG without seizures and  Neurology felt encephalopathy due to heroin overdose and muscle weakness likely due to rhabdomyolysis.  MRI cervical spine done due to hyperreflexia and reaveled C5/C6 subarticular small disc protrusion with mild to moderate foraminal stenosis--no evidence of SCI.  Supportive care recommended at this time. Patient has had issue with pain and agitation. Therapy evaluations done revealing impulsivity with poor safety, lack of insight into deficits, RLE weakness affecting mobility. CIR recommended for follow up therapy.   Lives with grandmother. Focused on getting home "as fast as possible". Reports friends will check in and assist after discharge.    Review of Systems  Constitutional: Negative for chills and fever.  HENT: Negative for hearing loss and tinnitus.     Eyes: Negative for blurred vision.  Respiratory: Negative for cough and shortness of breath.   Cardiovascular: Negative for chest pain and leg swelling.  Gastrointestinal: Negative for heartburn and nausea.  Genitourinary: Negative for dysuria and urgency.  Musculoskeletal: Negative for back pain, joint pain and myalgias.  Skin: Negative for rash.  Neurological: Positive for sensory change and focal weakness. Negative for speech change and weakness.  Psychiatric/Behavioral: Positive for memory loss. Negative for depression. The patient is not nervous/anxious and does not have insomnia.   All other systems reviewed and are negative.     Past Medical History:  Diagnosis Date  . Bipolar 1 disorder (HCC)   . Depression   . Family history of adverse reaction to anesthesia    "maternal grandmother "gets sick"  . History of gout   . Panic attack     Past Surgical History:  Procedure Laterality Date  . INGUINAL HERNIA REPAIR Bilateral 1984  . LAPAROSCOPIC CHOLECYSTECTOMY  ~ 2014    Family History  Problem Relation Age of Onset  . Brain cancer Paternal Grandfather     Social History:  reports that he has been smoking Cigarettes.  He has a 19.00 pack-year smoking history. He has never used smokeless tobacco. He reports that he drinks about 12.6 oz of alcohol per week . He reports that he uses drugs, including Marijuana.    Allergies  Allergen Reactions  . Penicillins Swelling    Has patient had a PCN reaction causing immediate rash, facial/tongue/throat swelling, SOB or lightheadedness with hypotension: Yes Has patient had a PCN reaction causing severe rash involving mucus membranes or skin necrosis: No Has patient had a PCN reaction that required hospitalization: No Has patient  had a PCN reaction occurring within the last 10 years: Yes If all of the above answers are "NO", then may proceed with Cephalosporin use.   . Sulfa Antibiotics Other (See Comments)    CHILDHOOD REACTION    Medications Prior to Admission  Medication Sig Dispense Refill  . ALPRAZolam (XANAX) 0.5 MG tablet Take 0.5-1 mg by mouth 4 (four) times daily as needed for anxiety.      Home: Home Living Family/patient expects to be discharged to:: Private residence Living Arrangements: Other relatives Database administrator) Available Help at Discharge: Family, Friend(s), Available 24 hours/day Type of Home: Mobile home Home Access: Stairs to enter [employer] of Steps: 2 Entrance Stairs-Rails: Can reach both Home Layout: One level Bathroom Shower/Tub: Engineer, manufacturing systems: Standard Home Equipment: None Additional Comments: Worried a RW will not fit in mobile home  Functional History: Prior Function Level of Independence: Independent Comments: Aunt reports multiple falls the past couple weeks, but pt does not recall this Functional Status:  Mobility: Bed Mobility Overal bed mobility: Needs Assistance Bed Mobility: Supine to Sit Supine to sit: Min guard General bed mobility comments: Min guard for safety, and for technique with IV line as pt impulsive with movement Transfers Overall transfer level: Needs assistance Equipment used: Rolling walker (2 wheeled) Transfers: Sit to/from Stand Sit to Stand: Min guard General transfer comment: Min guard for balance and technique with RW; pt impulsive with movement requiring repeated cues Ambulation/Gait Ambulation/Gait assistance: Min assist Ambulation Distance (Feet): 150 Feet Assistive device: Rolling walker (2 wheeled) Gait Pattern/deviations: Step-to pattern, Step-through pattern, Decreased step length - left, Decreased weight shift to right, Decreased dorsiflexion - right, Trendelenburg, Steppage General Gait Details: Amb with RW and minA for balance and cues for maneuvering RLE; pt reports numbness in R foot, but able to clear foot to take step with cues. Verbal/tactile cues to initiate R hip extension and step-through pattern.  Cues for technique with RW. Increased WOB with amb. Gait velocity: Decreased Gait velocity interpretation: <1.8 ft/sec, indicative of risk for recurrent falls    ADL:    Cognition: Cognition Overall Cognitive Status: Impaired/Different from baseline Orientation Level: Disoriented to time, Disoriented to situation, Disoriented to place, Oriented to person Cognition Arousal/Alertness: Awake/alert Behavior During Therapy: Impulsive Overall Cognitive Status: Impaired/Different from baseline Area of Impairment: Orientation, Attention, Memory, Following commands, Safety/judgement, Awareness, Problem solving Orientation Level: Disoriented to, Time, Situation Current Attention Level: Sustained Memory: Decreased short-term memory Following Commands: Follows multi-step commands inconsistently Safety/Judgement: Decreased awareness of safety, Decreased awareness of deficits Awareness: Emergent Problem Solving: Requires verbal cues, Requires tactile cues General Comments: Oriented to his name, names of family/friends in room, and [facility]; able to guess date and year with multiple cues, reorientated to date and situation. Able to recall home set-up correctly, as verified by aunt. Significant decrease in short term memory and safety awareness. Required multiple cues for safety with transfers and ambulation. Adament about returning home to "get back to normal life" despite explaining recs for further rehab.   Blood pressure 136/73, pulse 64, temperature (!) 97.5 F (36.4 C), temperature source Axillary, resp. rate 16, height 6\' 1"  (1.854 m), weight 81.6 kg (180 lb), SpO2 100 %. Physical Exam  Nursing note and vitals reviewed. Constitutional: He is oriented to person, place, and time. He appears well-developed and well-nourished. No distress.  Thin adult male with multiple tattoos.   HENT:  Head: Normocephalic and atraumatic.  Mouth/Throat: Oropharynx is clear and moist.  Eyes: Pupils are  equal,  round, and reactive to light. Conjunctivae and EOM are normal.  Neck: Normal range of motion. Neck supple.  Cardiovascular: Normal rate and regular rhythm.   Respiratory: Effort normal and breath sounds normal. No stridor. No respiratory distress. He has no wheezes.  GI: Soft. Bowel sounds are normal. He exhibits no distension. There is no tenderness.  Musculoskeletal: He exhibits no edema or tenderness.  Neurological: He is alert and oriented to person, place, and time.  Speech clear.  He was able to state name of Hospital but thought that he was in [place]. Unable to recall details of fall.  He is able to follow basic commands without difficulty. Dysesthesias RLE> LLE.  Poor insight/awareness.  Motor: 5/5 throughout, except right ADF/PF 0/5 Sensation diminished to light touch right foot  Skin: Skin is warm and dry. He is not diaphoretic.  Diffuse tatoos  Psychiatric: His speech is normal. His affect is inappropriate. Cognition and memory are impaired. He expresses impulsivity.    Results for orders placed or performed during the hospital encounter of 09/26/16 (from the past 24 hour(s))  Comprehensive metabolic panel     Status: Abnormal   Collection Time: 10/01/16  3:24 AM  Result Value Ref Range   Sodium 141 135 - 145 mmol/L   Potassium 3.5 3.5 - 5.1 mmol/L   Chloride 107 101 - 111 mmol/L   CO2 30 22 - 32 mmol/L   Glucose, Bld 105 (H) 65 - 99 mg/dL   BUN 9 6 - 20 mg/dL   Creatinine, Ser 4.09 0.61 - 1.24 mg/dL   Calcium 8.9 8.9 - 81.1 mg/dL   Total Protein 5.8 (L) 6.5 - 8.1 g/dL   Albumin 3.1 (L) 3.5 - 5.0 g/dL   AST 914 (H) 15 - 41 U/L   ALT 140 (H) 17 - 63 U/L   Alkaline Phosphatase 54 38 - 126 U/L   Total Bilirubin 0.9 0.3 - 1.2 mg/dL   GFR calc non Af Amer >60 >60 mL/min   GFR calc Af Amer >60 >60 mL/min   Anion gap 4 (L) 5 - 15  CK     Status: Abnormal   Collection Time: 10/01/16  7:00 AM  Result Value Ref Range   Total CK 5,055 (H) 49 - 397 U/L   Mr Cervical  Spine Wo Contrast  Result Date: 09/29/2016 CLINICAL DATA:  36 y/o M; history of polysubstance abuse presenting with inability to walk, lower back pain, left leg pain and numbness. EXAM: MRI CERVICAL SPINE WITHOUT CONTRAST TECHNIQUE: Multiplanar, multisequence MR imaging of the cervical spine was performed. No intravenous contrast was administered. COMPARISON:  09/26/2016 CT cervical spine. FINDINGS: Motion degraded study. Alignment: Straightening of cervical lordosis.  No listhesis. Vertebrae: No fracture, evidence of discitis, or bone lesion. Cord: Normal signal and morphology. Posterior Fossa, vertebral arteries, paraspinal tissues: Increased T2 signal within the hippocampi by a as seen on prior MRI of the brain. Disc levels: C2-3: No significant disc displacement, foraminal narrowing, or canal stenosis. C3-4: No significant disc displacement, foraminal narrowing, or canal stenosis. C4-5: No significant disc displacement, foraminal narrowing, or canal stenosis. C5-6: Right subarticular to foraminal small disc protrusion with mild-to-moderate right foraminal stenosis. C6-7: No significant disc displacement, foraminal narrowing, or canal stenosis. C7-T1: No significant disc displacement, foraminal narrowing, or canal stenosis. IMPRESSION: 1. No abnormal cord or bone marrow signal. 2. C5-6 right subarticular to foraminal disc protrusion with mild-to-moderate right foraminal stenosis. 3. No significant canal stenosis. Electronically Signed   By: Micah Noel  [place]Stratton M.D.   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  Records reviewed and summated above.  1. Does the need for close, 24 hr/day medical supervision in concert with the patient's rehab needs make it unreasonable for this patient to be served in a less intensive setting? Potentially 2. Co-Morbidities requiring supervision/potential  complications: bipolar disorder (meds), schizophrenia (meds),  polysubstance abuse (counsel when appropriate), hypokalemia (continue to monitor and replete as necessary), transaminitis, leukocytosis (cont to monitor for signs and symptoms of infection, further workup if indicated) 3. Due to safety, disease management and patient education, does the patient require 24 hr/day rehab nursing? Yes 4. Does the patient require coordinated care of a physician, rehab nurse, PT (1-2 hrs/day, 5 days/week), OT (1-2 hrs/day, 5 days/week) and SLP (1-2 hrs/day, 5 days/week) to address physical and functional deficits in the context of the above medical diagnosis(es)? Potentially Addressing deficits in the following areas: balance, endurance, locomotion, bathing, dressing, toileting, cognition and psychosocial support 5. Can the patient actively participate in an intensive therapy program of at least 3 hrs of therapy per day at least 5 days per week? Yes 6. The potential for patient to make measurable gains while on inpatient rehab is good 7. Anticipated functional outcomes upon discharge from inpatient rehab are supervision  with PT, supervision with OT, supervision with SLP. 8. Estimated rehab length of stay to reach the above functional goals is: 5-9 days. 9. Anticipated D/C setting: Home 10. Anticipated post D/C treatments: HH therapy and Home excercise program 11. Overall Rehab/Functional Prognosis: good  RECOMMENDATIONS: This patient's condition is appropriate for continued rehabilitative care in the following setting: Pt doing relatively well on day of eval limited by pain per patient.  Will cont to follow as he becomes medically stable and consider CIR admission if deficits persist and patient agreeable to hospital stay.  At present, pt states he want to go home, coaxed by family to stay, but then states he wants to go home again.  Patient has agreed to participate in recommended program. Potentially Note  that insurance prior authorization may be required for reimbursement for recommended care.  Comment: Rehab Admissions Coordinator to follow up.  Maryla MorrowAnkit Patel, MD, Earlie CountsFAAPMR Love, Pamela S, [place] JerseyPA-C 10/01/2016

## 2016-10-01 NOTE — Plan of Care (Signed)
Problem: Safety: Goal: Ability to remain free from injury will improve Patients belongings and call light is within reach. Patient calls for assistance when needed.

## 2016-10-02 LAB — HEPATIC FUNCTION PANEL
ALT: 99 U/L — AB (ref 17–63)
AST: 107 U/L — ABNORMAL HIGH (ref 15–41)
Albumin: 2.9 g/dL — ABNORMAL LOW (ref 3.5–5.0)
Alkaline Phosphatase: 48 U/L (ref 38–126)
BILIRUBIN DIRECT: 0.2 mg/dL (ref 0.1–0.5)
BILIRUBIN INDIRECT: 0.6 mg/dL (ref 0.3–0.9)
Total Bilirubin: 0.8 mg/dL (ref 0.3–1.2)
Total Protein: 5.4 g/dL — ABNORMAL LOW (ref 6.5–8.1)

## 2016-10-02 LAB — ANAEROBIC CULTURE

## 2016-10-02 LAB — CK: Total CK: 1599 U/L — ABNORMAL HIGH (ref 49–397)

## 2016-10-02 MED ORDER — HALOPERIDOL LACTATE 5 MG/ML IJ SOLN: 2.0000 mg | Freq: Four times a day (QID) | INTRAMUSCULAR | Status: DC | PRN

## 2016-10-02 MED ORDER — HALOPERIDOL LACTATE 5 MG/ML IJ SOLN
5.0000 mg | Freq: Once | INTRAMUSCULAR | Status: AC
  Administered 2016-10-02: 5 mg via INTRAMUSCULAR
  Filled 2016-10-02: qty 1

## 2016-10-02 MED ORDER — IBUPROFEN 200 MG PO TABS
400.0000 mg | ORAL_TABLET | Freq: Four times a day (QID) | ORAL | Status: DC | PRN
Start: 2016-10-02 — End: 2016-10-06
  Administered 2016-10-02 – 2016-10-06 (×9): 400 mg via ORAL
  Filled 2016-10-02 (×9): qty 2

## 2016-10-02 MED ORDER — HALOPERIDOL 1 MG PO TABS
2.0000 mg | ORAL_TABLET | Freq: Four times a day (QID) | ORAL | Status: DC | PRN
  Administered 2016-10-02 – 2016-10-06 (×6): 2 mg via ORAL
  Filled 2016-10-02 (×2): qty 1
  Filled 2016-10-02 (×2): qty 2
  Filled 2016-10-02: qty 1
  Filled 2016-10-02 (×2): qty 2
  Filled 2016-10-02: qty 1
  Filled 2016-10-02: qty 2

## 2016-10-02 NOTE — Progress Notes (Signed)
Patient has refused IV multiple times today, Would not allow IV nurse to insert IV. MD is aware. Will attempt again when and if pt is agreeable.

## 2016-10-02 NOTE — Progress Notes (Signed)
PROGRESS NOTE                                                                                                                                                                                                             Patient Demographics:    Phillip Mcdowell, is a 36 y.o. male, DOB - 06-18-1980, ZOX:[id number]  Admit date - 09/26/2016   Admitting Physician Standley Brooking, MD  Outpatient Primary MD for the patient is Patient, No Pcp Per  LOS - 6  Outpatient Specialists:None  Chief Complaint  Patient presents with  . Back Pain       Brief Narrative  36 year old male with polysubstance abuse (alcohol, injected heroin with fentanyl) presented to the ED with inability to walk, low back pain, left leg pain and numbness. As per his mother patient has had frequent falls in the past 2 weeks. In the ED workup showed multiple abnormalities including leukocytosis, elevated LFTs, rhabdomyolysis. CT of the head showed abnormal appearance of the temporal horns of the lateral ventricles uncertain for meningitis versus drug use. Patient was transferred to [facility] for further evaluation.    Subjective:   Apparently was agitated yesterday evening (her family, nursing staff not aware). Reports some pain in his right leg and some numbness. And is worried that patient's grandfather died yesterday and when he hears and yesterday he would be more agitated.   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 Also shows satellite lacunar infarcts. Hyperreflexia on exam is likely due to basal ganglia changes. MRI of the cervical spine without abnormal cord or bone marrow signal. Shows C5-C6 foraminal disc  protrusion with mild to moderate foraminal stenosis.   Seen by PT and recommend CIR given his poor awareness and risk of fall. Pending insurance authorization. Seen by SLP for cognitive deficits.  Acute encephalopathy Possibly secondary to hippocampal infarct. Better oriented since yesterday but still having some waxing and waning confusion. Likely will have some residual cognitive impairment despite maximum therapy.  EEG with diffuse nonspecific cerebral dysfunction . Neurology consult appreciated.    Left hip joint effusion Suspect inflammatory from synovial fluid analysis. Cultures negative  and negative for crystals. No pain or tenderness on hip movement. Discontinued empiric antibiotics.  Acute transaminitis secondary to rhabdomyolysis.  Both LFTs and CPK slowly improving. Continue hydration. Avoid hepatotoxic agents or use with caution.  Hypokalemia Replenished.   Elevated troponin Secondary to rhabdomyolysis. No chest pain or significant EKG changes.  HCV antibody positive Needs outpatient follow-up.  Possible pancreatitis Elevated lipase with mild retroperitoneal edema and pancreatic thickening. Lipase trended down. No abdominal pain or tenderness. Has active alcohol use.  Dehydration and hyponatremia Improved with IV fluids  Polysubstance use Daily alcohol and marijuana use. Urine toxin positive for amphetamines, opiates and benzodiazepines. Prior IV heroin use. On CIWA  Agitation Seroquel at bedtime which seems to have helped his symptoms.. Added when necessary Haldol. Monitor on CIWA.  Code Status : Full code  Family Communication  : Discussed with aunt at bedside  Disposition Plan  : CIR consult  Barriers For Discharge : Improving symptoms, CIR versus SNF upon discharge  Consults  :  Neurology IR  Procedures  : CT head/cervical spine MRI brain EEG 2-D echo Hip aspiration  DVT Prophylaxis  :  Lovenox -  Lab Results  Component Value Date   PLT  262 09/28/2016    Antibiotics  :    Anti-infectives    Start     Dose/Rate Route Frequency Ordered Stop   09/27/16 0000  cefTRIAXone (ROCEPHIN) 2 g in dextrose 5 % 50 mL IVPB  Status:  Discontinued     2 g 100 mL/hr over 30 Minutes Intravenous Every 12 hours 09/26/16 1716 09/28/16 1345   09/26/16 2000  vancomycin (VANCOCIN) IVPB 1000 mg/200 mL premix  Status:  Discontinued     1,000 mg 200 mL/hr over 60 Minutes Intravenous Every 8 hours 09/26/16 1724 09/28/16 1345   09/26/16 1130  cefTRIAXone (ROCEPHIN) 2 g in dextrose 5 % 50 mL IVPB     2 g 100 mL/hr over 30 Minutes Intravenous  Once 09/26/16 1117 09/26/16 1157   09/26/16 1130  vancomycin (VANCOCIN) IVPB 1000 mg/200 mL premix     1,000 mg 200 mL/hr over 60 Minutes Intravenous  Once 09/26/16 1117 09/26/16 1234   09/26/16 1130  acyclovir (ZOVIRAX) 815 mg in dextrose 5 % 150 mL IVPB     10 mg/kg  81.6 kg 166.3 mL/hr over 60 Minutes Intravenous  Once 09/26/16 1119 09/26/16 1355        Objective:   Vitals:   09/30/16 2122 10/01/16 0540 10/01/16 1954 10/02/16 0500  BP: 132/66 136/73 (!) 147/78 136/75  Pulse: 65 64 84 78  Resp: 19 16  18   Temp: 98.4 F (36.9 C) (!) 97.5 F (36.4 C) 98.3 F (36.8 C) 99.2 F (37.3 C)  TempSrc: Oral Axillary Oral Oral  SpO2: 100% 100% 100% 100%  Weight:      Height:        Wt Readings from Last 3 Encounters:  09/26/16 81.6 kg (180 lb)  05/01/15 81.6 kg (180 lb)  09/07/14 83.9 kg (185 lb)     Intake/Output Summary (Last 24 hours) at 10/02/16 1235 Last data filed at 10/02/16 0900  Gross per 24 hour  Intake              480 ml  Output              500 ml  Net              -20 ml    Physical exam Gen.: Middle aged  male not in distress HEENT: Moist mucosa, supple neck Chest: Clear bilaterally CVS: Normal S1 and S2, no murmurs GI: Soft, nontender, nondistended Musculoskeletal: Warm, no edema  CNS: Alert and oriented 2, improved bilateral lower extremity strength, improve  hyperreflexia       Data Review:    CBC  Recent Labs Lab 09/26/16 0924 09/27/16 0511 09/28/16 1106  WBC 18.5* 24.3* 11.2*  HGB 18.1* 18.2* 14.7  HCT 50.5 49.9 42.1  PLT 185 327 262  MCV 94.7 92.9 93.6  MCH 34.0 33.9 32.7  MCHC 35.8 36.5* 34.9  RDW 13.6 13.6 13.6  LYMPHSABS 1.3 1.3  --   MONOABS 1.5* 2.1*  --   EOSABS 0.2 0.0  --   BASOSABS 0.0 0.0  --     Chemistries   Recent Labs Lab 09/26/16 0924 09/27/16 0511 09/28/16 1106 09/30/16 0522 10/01/16 0324 10/02/16 0303  NA 128* 134* 134* 140 141  --   K 4.0 4.9 3.9 2.8* 3.5  --   CL 99* 100* 103 106 107  --   CO2 21* 23 23 24 30   --   GLUCOSE 149* 132* 99 148* 105*  --   BUN 12 11 15 9 9   --   CREATININE 0.97 1.15 1.00 0.91 0.84  --   CALCIUM 8.6* 9.2 8.5* 8.6* 8.9  --   MG  --   --   --  1.7  --   --   AST 719* 645* 360* 354* 239* 107*  ALT 231* 236* 152* 148* 140* 99*  ALKPHOS 93 87 55 43 54 48  BILITOT 1.6* 1.0 0.9 0.9 0.9 0.8   ------------------------------------------------------------------------------------------------------------------ No results for input(s): CHOL, HDL, LDLCALC, TRIG, CHOLHDL, LDLDIRECT in the last 72 hours.  No results found for: HGBA1C ------------------------------------------------------------------------------------------------------------------ No results for input(s): TSH, T4TOTAL, T3FREE, THYROIDAB in the last 72 hours.  Invalid input(s): FREET3 ------------------------------------------------------------------------------------------------------------------ No results for input(s): VITAMINB12, FOLATE, FERRITIN, TIBC, IRON, RETICCTPCT in the last 72 hours.  Coagulation profile No results for input(s): INR, PROTIME in the last 168 hours.  No results for input(s): DDIMER in the last 72 hours.  Cardiac Enzymes  Recent Labs Lab 09/26/16 0924 09/26/16 1545  TROPONINI 0.58* 0.57*    ------------------------------------------------------------------------------------------------------------------ No results found for: BNP  Inpatient Medications  Scheduled Meds: . folic acid  1 mg Oral Daily  . multivitamin with minerals  1 tablet Oral Daily  . nicotine  21 mg Transdermal Daily  . QUEtiapine  50 mg Oral QHS  . thiamine  100 mg Oral Daily   Continuous Infusions:  PRN Meds:.acetaminophen **OR** acetaminophen, haloperidol lactate, ibuprofen, LORazepam **OR** LORazepam, ondansetron **OR** ondansetron (ZOFRAN) IV  Micro Results Recent Results (from the past 240 hour(s))  Blood culture (routine x 2)     Status: None   Collection Time: 09/26/16  9:25 AM  Result Value Ref Range Status   Specimen Description   Final    BLOOD BLOOD RIGHT WRIST BOTTLES DRAWN AEROBIC AND ANAEROBIC   Special Requests   Final    Blood Culture results may not be optimal due to an inadequate volume of blood received in culture bottles   Culture NO GROWTH 5 DAYS  Final   Report Status 10/01/2016 FINAL  Final  Anaerobic culture     Status: None   Collection Time: 09/26/16  7:15 PM  Result Value Ref Range Status   Specimen Description SYNOVIAL FLUID LEFT HIP  Final   Special Requests NONE  Final   Culture  NO ANAEROBES ISOLATED  Final   Report Status 10/02/2016 FINAL  Final  Body fluid culture     Status: None   Collection Time: 09/26/16  7:15 PM  Result Value Ref Range Status   Specimen Description SYNOVIAL FLUID LEFT HIP  Final   Special Requests NONE  Final   Gram Stain   Final    MODERATE WBC PRESENT, PREDOMINANTLY PMN NO ORGANISMS SEEN    Culture NO GROWTH 3 DAYS  Final   Report Status 09/30/2016 FINAL  Final    Radiology Reports Ct Head Wo Contrast  Result Date: 09/27/2016 CLINICAL DATA:  36 y/o  M; substance abuse and abnormal MRI. EXAM: CT HEAD WITHOUT CONTRAST TECHNIQUE: Contiguous axial images were obtained from the base of the skull through the vertex without  intravenous contrast. COMPARISON:  09/27/2016 MRI head. FINDINGS: Brain: Hypoattenuation and mild mass effect of the hippocampi by corresponding to reduced diffusion on prior MRI. Punctate foci of reduced diffusion on MRI are not visible on CT. No evidence for new acute intracranial hemorrhage, infarct, focal mass effect, hydrocephalus, or extra-axial collection. Vascular: No hyperdense vessel or unexpected calcification. Skull: Normal. Negative for fracture or focal lesion. Sinuses/Orbits: No acute finding. Other: None. IMPRESSION: 1. Hypoattenuation and mild mass effect of the hippocampi corresponding to diffusion abnormality on MRI. 2. No evidence for new acute intracranial hemorrhage, acute infarct, or focal mass effect. Electronically Signed   By: Mitzi Hansen M.D.   On: 09/27/2016 21:40   Ct Head Wo Contrast  Result Date: 09/26/2016 CLINICAL DATA:  Numbness in legs.  Found down. EXAM: CT HEAD WITHOUT CONTRAST CT CERVICAL SPINE WITHOUT CONTRAST TECHNIQUE: Multidetector CT imaging of the head and cervical spine was performed following the standard protocol without intravenous contrast. Multiplanar CT image reconstructions of the cervical spine were also generated. COMPARISON:  None. FINDINGS: CT HEAD FINDINGS Brain: Ventricles are normal in size and configuration. However, temporal horns of the lateral ventricles have an abnormal appearance bilaterally, perhaps indicating adjacent hippocampal edema and/or debris related to underlying meningitis. Remainder of the brain demonstrate normal gray-white matter attenuation. No parenchymal or extra-axial hemorrhage. Vascular: No hyperdense vessel or unexpected calcification. Skull: Normal. Negative for fracture or focal lesion. Sinuses/Orbits: No acute finding. Other: None. CT CERVICAL SPINE FINDINGS Alignment: Dextroscoliosis. No evidence of acute vertebral body subluxation. Skull base and vertebrae: No fracture line or displaced fracture fragment  identified. Facet joints appear intact and normal in alignment. Soft tissues and spinal canal: No prevertebral fluid or swelling. No visible canal hematoma. Disc levels: Disc spaces are well preserved throughout. No significant central canal stenosis at any level. Upper chest: Mild emphysematous change at the lung apices. No acute findings. Other: None. IMPRESSION: 1. Temporal horns of the lateral ventricles have an abnormal appearance bilaterally, perhaps indicating adjacent hippocampal edema and/or debris related to underlying meningitis. This appearance can also be related to drug use. Recommend brain MRI with contrast for further characterization. 2. No fracture or acute subluxation within the cervical spine. Scoliosis. 3. Emphysema. These results were called by telephone at the time of interpretation on 09/26/2016 at 11:11 am to Dr. Margarita Grizzle , who verbally acknowledged these results. Electronically Signed   By: Bary Richard M.D.   On: 09/26/2016 11:12   Ct Cervical Spine Wo Contrast  Result Date: 09/26/2016 CLINICAL DATA:  Numbness in legs.  Found down. EXAM: CT HEAD WITHOUT CONTRAST CT CERVICAL SPINE WITHOUT CONTRAST TECHNIQUE: Multidetector CT imaging of the head and cervical spine was performed  following the standard protocol without intravenous contrast. Multiplanar CT image reconstructions of the cervical spine were also generated. COMPARISON:  None. FINDINGS: CT HEAD FINDINGS Brain: Ventricles are normal in size and configuration. However, temporal horns of the lateral ventricles have an abnormal appearance bilaterally, perhaps indicating adjacent hippocampal edema and/or debris related to underlying meningitis. Remainder of the brain demonstrate normal gray-white matter attenuation. No parenchymal or extra-axial hemorrhage. Vascular: No hyperdense vessel or unexpected calcification. Skull: Normal. Negative for fracture or focal lesion. Sinuses/Orbits: No acute finding. Other: None. CT CERVICAL  SPINE FINDINGS Alignment: Dextroscoliosis. No evidence of acute vertebral body subluxation. Skull base and vertebrae: No fracture line or displaced fracture fragment identified. Facet joints appear intact and normal in alignment. Soft tissues and spinal canal: No prevertebral fluid or swelling. No visible canal hematoma. Disc levels: Disc spaces are well preserved throughout. No significant central canal stenosis at any level. Upper chest: Mild emphysematous change at the lung apices. No acute findings. Other: None. IMPRESSION: 1. Temporal horns of the lateral ventricles have an abnormal appearance bilaterally, perhaps indicating adjacent hippocampal edema and/or debris related to underlying meningitis. This appearance can also be related to drug use. Recommend brain MRI with contrast for further characterization. 2. No fracture or acute subluxation within the cervical spine. Scoliosis. 3. Emphysema. These results were called by telephone at the time of interpretation on 09/26/2016 at 11:11 am to Dr. Margarita Grizzle , who verbally acknowledged these results. Electronically Signed   By: Bary Richard M.D.   On: 09/26/2016 11:12   Ct Thoracic Spine Wo Contrast  Result Date: 09/26/2016 CLINICAL DATA:  Suspected fall yesterday.  Limited history. EXAM: CT THORACIC AND LUMBAR SPINE WITHOUT CONTRAST TECHNIQUE: Multidetector CT imaging of the thoracic and lumbar spine was performed without contrast. Multiplanar CT image reconstructions were also generated. COMPARISON:  None. FINDINGS: CT THORACIC SPINE FINDINGS Alignment: Mild exaggerated kyphosis.  No traumatic malalignment. Vertebrae: Negative for fracture or bone lesion. Paraspinal and other soft tissues: No acute finding. 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 Disc levels: No evidence of degenerative impingement. Ligamentum flavum thickening in ossification at T4-5 and T5-6. CT LUMBAR SPINE FINDINGS Segmentation: 5 lumbar type vertebrae. Alignment: Normal. Vertebrae: No  acute fracture or focal pathologic process. Paraspinal and other soft tissues: Mild retroperitoneal edema centered in the central and left upper quadrant. The pancreatic tail appears somewhat thickened and indistinct diffusely. Disc levels: No significant degenerative changes or evidence of impingement. IMPRESSION: CT THORACIC SPINE IMPRESSION No evidence of injury. CT LUMBAR SPINE IMPRESSION 1. No evidence of lumbar spine injury. 2. Mild retroperitoneal edema and pancreatic tail thickening, please correlate for pancreatitis by exam and labs. Electronically Signed   By: Marnee Spring M.D.   On: 09/26/2016 10:58   Ct Lumbar Spine Wo Contrast  Result Date: 09/26/2016 CLINICAL DATA:  Suspected fall yesterday.  Limited history. EXAM: CT THORACIC AND LUMBAR SPINE WITHOUT CONTRAST TECHNIQUE: Multidetector CT imaging of the thoracic and lumbar spine was performed without contrast. Multiplanar CT image reconstructions were also generated. COMPARISON:  None. FINDINGS: CT THORACIC SPINE FINDINGS Alignment: Mild exaggerated kyphosis.  No traumatic malalignment. Vertebrae: Negative for fracture or bone lesion. Paraspinal and other soft tissues: No acute finding. 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 Disc levels: No evidence of degenerative impingement. Ligamentum flavum thickening in ossification at T4-5 and T5-6. CT LUMBAR SPINE FINDINGS Segmentation: 5 lumbar type vertebrae. Alignment: Normal. Vertebrae: No acute fracture or focal pathologic process. Paraspinal and other soft tissues: Mild retroperitoneal edema centered  in the central and left upper quadrant. The pancreatic tail appears somewhat thickened and indistinct diffusely. Disc levels: No significant degenerative changes or evidence of impingement. IMPRESSION: CT THORACIC SPINE IMPRESSION No evidence of injury. CT LUMBAR SPINE IMPRESSION 1. No evidence of lumbar spine injury. 2. Mild retroperitoneal edema and pancreatic tail thickening, please correlate for  pancreatitis by exam and labs. Electronically Signed   By: Marnee SpringJonathon  Watts M.D.   On: 09/26/2016 10:58   Ct Pelvis Wo Contrast  Result Date: 09/26/2016 CLINICAL DATA:  Bilateral lower extremity numbness. The patient is unable to walk. Possible fall yesterday. Elevated white blood cell count. EXAM: CT PELVIS WITHOUT CONTRAST TECHNIQUE: Multidetector CT imaging of the pelvis was performed following the standard protocol without intravenous contrast. COMPARISON:  None. FINDINGS: Urinary Tract:  Negative. Bowel:  Imaged bowel loops and the appendix appear normal. Vascular/Lymphatic: No pathologically enlarged lymph nodes. No significant vascular abnormality seen. Reproductive:  Negative. Other:  No intra-abdominal fluid collection. Musculoskeletal: Multifocal muscular edema is identified. Involved muscles include the right gluteus maximus and medius, left gluteus medius and minimus and left obturator externus. Left hip joint effusion is identified. Subcutaneous stranding is seen about the right buttock and left greater trochanter. No bony destructive change or periosteal reaction is identified. No avascular necrosis of the femoral heads. Small synovial herniation pit about both hips are noted. IMPRESSION: Multifocal muscle edema about the pelvis most most worrisome for infectious myositis in this patient with an elevated white blood cell count. Rhabdomyolysis is possible. Left hip joint effusion worrisome for septic joint given elevated white count. No evidence of osteomyelitis. Subcutaneous stranding about the left hip and right buttock likely due to cellulitis. Electronically Signed   By: Drusilla Kannerhomas  Dalessio M.D.   On: 09/26/2016 11:17   Mr Brain Wo Contrast  Addendum Date: 09/27/2016   ADDENDUM REPORT: 09/27/2016 13:59 ADDENDUM: Acute findings discussed with and reconfirmed by Dr.Kirkpatrick, Neurology on 09/27/2016 at 1:45pm. Electronically Signed   By: Awilda Metroourtnay  Bloomer M.D.   On: 09/27/2016 13:59   Result Date:  09/27/2016 CLINICAL DATA:  Weakness and pain, follow-up abnormal temporal horns. Evaluate for meningitis. History of polysubstance abuse. EXAM: MRI HEAD WITHOUT CONTRAST TECHNIQUE: Multiplanar, multiecho pulse sequences of the brain and surrounding structures were obtained without intravenous contrast. COMPARISON:  CT HEAD September 26, 2016 FINDINGS: Multiple sequences are mildly or moderately motion degraded. INTRACRANIAL CONTENTS: Symmetric reduced diffusion bilateral hippocampi with low ADC values and bright FLAIR T2 hyperintense signal. Subcentimeter foci of reduced diffusion bilateral basal ganglia, LEFT cerebellum. No intraventricular reduced diffusion. No abnormal susceptibility artifact to suggest hemorrhage. No midline shift, mass effect or masses. No abnormal extra-axial fluid collections. VASCULAR: Normal major intracranial vascular flow voids present at skull base. SKULL AND UPPER CERVICAL SPINE: No abnormal sellar expansion. No suspicious calvarial bone marrow signal. Craniocervical junction maintained. SINUSES/ORBITS: The mastoid air-cells and included paranasal sinuses are well-aerated.The included ocular globes and orbital contents are non-suspicious. OTHER: None. IMPRESSION: 1. 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 Findings less likely reflect herpes or limbic encephalitis. Additional satellite lacunar infarcts. 2. No imaging findings of meningitis by noncontrast MRI. Electronically Signed: By: Awilda Metroourtnay  Bloomer M.D. On: 09/27/2016 13:51   Mr Cervical Spine Wo Contrast  Result Date: 09/29/2016 CLINICAL DATA:  36 y/o M; history of polysubstance abuse presenting with inability to walk, lower back pain, left leg pain and numbness. EXAM: MRI CERVICAL SPINE WITHOUT CONTRAST TECHNIQUE: Multiplanar, multisequence MR imaging of the cervical spine was performed. No  intravenous contrast was administered. COMPARISON:  09/26/2016 CT cervical spine. FINDINGS: Motion  degraded study. Alignment: Straightening of cervical lordosis.  No listhesis. Vertebrae: No fracture, evidence of discitis, or bone lesion. Cord: Normal signal and morphology. Posterior Fossa, vertebral arteries, paraspinal tissues: Increased T2 signal within the hippocampi by a as seen on prior MRI of the brain. Disc levels: C2-3: No significant disc displacement, foraminal narrowing, or canal stenosis. C3-4: No significant disc displacement, foraminal narrowing, or canal stenosis. C4-5: No significant disc displacement, foraminal narrowing, or canal stenosis. C5-6: Right subarticular to foraminal small disc protrusion with mild-to-moderate right foraminal stenosis. C6-7: No significant disc displacement, foraminal narrowing, or canal stenosis. C7-T1: No significant disc displacement, foraminal narrowing, or canal stenosis. IMPRESSION: 1. No abnormal cord or bone marrow signal. 2. C5-6 right subarticular to foraminal disc protrusion with mild-to-moderate right foraminal stenosis. 3. No significant canal stenosis. Electronically Signed   By: Mitzi Hansen M.D.   On: 09/29/2016 19:55   Dg Chest Port 1 View  Result Date: 09/26/2016 CLINICAL DATA:  Pain. EXAM: PORTABLE CHEST 1 VIEW COMPARISON:  None. FINDINGS: Normal heart size and mediastinal contours. No acute infiltrate or edema. No effusion or pneumothorax. No acute osseous findings. IMPRESSION: Negative portable chest. Electronically Signed   By: Marnee Spring M.D.   On: 09/26/2016 09:25   Dg Fluoro Guided Needle Plc Aspiration/injection Loc  Result Date: 09/28/2016 CLINICAL DATA:  Bilateral lower extremity numbness. Left hip fluid noted on recent imaging. History of polysubstance abuse. EXAM: LEFT HIP ASPIRATION UNDER FLUOROSCOPY FLUOROSCOPY TIME:  Fluoroscopy Time:  30 seconds Number of Acquired Spot Images: 3 PROCEDURE: Informed consent was obtained from the patient's parents. The patient's mental status was altered and he was unable to give  consent. Left common femoral vein was palpated. Left femoral head and neck region was identified with fluoroscopy. Skin was prepped with Betadine. Sterile field was created. Skin was anesthetized with 1% lidocaine. 20 gauge spinal needle was directed onto the lateral aspect of the left femoral head and neck junction with fluoroscopic guidance. 2 mL of yellow joint fluid was aspirated. Needle was removed without complication. Bandage placed over the puncture site. IMPRESSION: Technically successful left hip aspiration with fluoroscopy. Fluid was sent for analysis. Electronically Signed   By: Richarda Overlie M.D.   On: 09/28/2016 07:49   US Abdomen Limited Ruq  Result Date: 09/26/2016 CLINICAL DATA:  Initial evaluation for elevated transaminitis. EXAM: ULTRASOUND ABDOMEN LIMITED RIGHT UPPER QUADRANT COMPARISON:  None available. FINDINGS: Gallbladder: Evaluation of the gallbladder somewhat limited due to overlying bowel gas and patient positioning. No definite echogenic stones or sludge identified. Gallbladder wall measure within normal limits a 2.3 mm. No free pericholecystic fluid. No sonographic Murphy sign elicited on exam. Common bile duct: Diameter: 2.9 mm Liver: No focal lesion identified. Within normal limits in parenchymal echogenicity. IMPRESSION: Grossly normal right upper quadrant ultrasound without cholelithiasis, evidence for acute cholecystitis, or biliary dilatation. Please note that evaluation of the gallbladder mildly limited due to shadowing from overlying bowel gas and patient positioning. Electronically Signed   By: Rise Mu M.D.   On: 09/26/2016 21:43    Time Spent in minutes  25   Eddie North M.D on 10/02/2016 at 12:35 PM  Between 7am to 7pm - Pager - [phone]  After 7pm go to [email or web address] - password [facility]  [facility] -  Office  [phone]

## 2016-10-02 NOTE — Evaluation (Signed)
Occupational Therapy Evaluation Patient Details Name: Phillip Mcdowell MRN: [id number] DOB: 04/23/1980 Today's Date: 10/02/2016    History of Present Illness Pt is 36 y.o. male admitted to ED with polysubstance abuse (alcohol, injected heroin with fentanyl) on 09/26/16 with inability to walk, low back pain, left leg pain and numbness. Pelvic CT shows muscle edema worrisome for infectious myositis; rhabdomyolysis possible. MRI 8/6 shows bilat hippocampi infarc assoc with durg overdose, and additional satellite lacunar infarcts. 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 Pertinent PMH includes bipolar, depression.   Clinical Impression   Upon arrival, pt supine in bed and reports that he is waiting for the paperwork to go home. Provided education on OT role, and pt states he doesn't need therapy because he is leaving. Pt declining to perform OOB activity or ADLs with OT. Pt demonstrating ability don/dof at bed level. Pt demonstrating decreased attention, awareness, memory, and problem solving. At end of session, pt aunt verbalized concern about pt's impulsivity, well-being, and safety during ADLs at home. Feel pt would benefit from further acute OT to increase safety and functional performance with ADLs and functional mobility. Recommend dc for post-acute rehab to optimize safety and independence with ADLs prior to transitioning home.     Follow Up Recommendations  CIR;Supervision/Assistance - 24 hour    Equipment Recommendations  None recommended by OT    Recommendations for Other Services PT consult     Precautions / Restrictions Precautions Precautions: Fall Restrictions Weight Bearing Restrictions: No      Mobility Bed Mobility               General bed mobility comments: Pt refusing OOB activity  Transfers                 General transfer comment: Declined OOB activity    Balance Overall balance assessment:  Needs assistance Sitting-balance support: No upper extremity supported;Feet supported Sitting balance-Leahy Scale: Fair                                     ADL either performed or assessed with clinical judgement   ADL Overall ADL's : Needs assistance/impaired Eating/Feeding: Set up;Sitting   Grooming: Set up;Sitting               Lower Body Dressing: Bed level;Set up;Supervision/safety Lower Body Dressing Details (indicate cue type and reason): Pt donned/doffed socks while in bed            Tub/Shower Transfer Details (indicate cue type and reason): Educated pt on tub transfers with shower seat and RW.    General ADL Comments: Evaluation limited due to pt refusing to participate. Pt allowed for education through demo on tub transfers and LB dressing. Pt aunt present for session and spoke with OT privately after eval. Pt aunt reporting that she is very concerned since he has poor memor and congnition.      Vision         Perception     Praxis      Pertinent Vitals/Pain Pain Assessment: Faces Faces Pain Scale: Hurts a little bit Pain Location: Legs and arms Pain Descriptors / Indicators: Aching Pain Intervention(s): Monitored during session     Hand Dominance Right   Extremity/Trunk Assessment Upper Extremity Assessment Upper Extremity Assessment: Overall WFL for tasks assessed   Lower Extremity Assessment Lower Extremity Assessment: RLE deficits/detail RLE  Deficits / Details: Pt with poor light touch sensation in R foot. Pt unable to state with therapist would touch his foot and made up answers during assessment of sensation. Pt with poor ankle DF of 0/5 RLE Sensation: decreased light touch   Cervical / Trunk Assessment Cervical / Trunk Assessment: Normal   Communication Communication Communication: No difficulties   Cognition Arousal/Alertness: Awake/alert Behavior During Therapy: Impulsive (Pt repeating that he is done and wants to  leave this place) Overall Cognitive Status: Impaired/Different from baseline Area of Impairment: Attention;Memory;Following commands;Safety/judgement;Awareness;Problem solving                   Current Attention Level: Sustained Memory: Decreased short-term memory Following Commands: Follows multi-step commands inconsistently Safety/Judgement: Decreased awareness of safety;Decreased awareness of deficits Awareness: Emergent Problem Solving: Requires verbal cues;Requires tactile cues General Comments: Due to pt's aggitation and wanting to leave the hospital, OT eval limited to education and ADLs at bed level. Pt refusing to work with OT or move OOB stating he won't give in and wants to leave. During eval, pt would perseverate on topics such as having a hand held shower head at his bathroom.    General Comments  Pt aunt present during session    Exercises Exercises: Other exercises Other Exercises Other Exercises: Educated pt on LE excercises including marching, leg raises, and ankle pumps.    Shoulder Instructions      Home Living Family/patient expects to be discharged to:: Private residence Living Arrangements: Other relatives Database administrator) Available Help at Discharge: Family;Friend(s);Available 24 hours/day Type of Home: Mobile home Home Access: Stairs to enter Entrance Stairs-Number of Steps: 2 Entrance Stairs-Rails: Can reach both Home Layout: One level     Bathroom Shower/Tub: Chief Strategy Officer: Standard     Home Equipment: Shower seat;Hand held shower head   Additional Comments: Worried a RW will not fit in mobile home      Prior Functioning/Environment Level of Independence: Independent        Comments: Aunt reports multiple falls the past couple weeks, but pt does not recall this        OT Problem List: Decreased strength;Decreased activity tolerance;Decreased safety awareness;Decreased knowledge of use of DME or AE      OT  Treatment/Interventions: Self-care/ADL training;Therapeutic exercise;Energy conservation;DME and/or AE instruction;Therapeutic activities;Patient/family education    OT Goals(Current goals can be found in the care plan section) Acute Rehab OT Goals Patient Stated Goal: Return home OT Goal Formulation: With patient Time For Goal Achievement: 10/16/16 Potential to Achieve Goals: Good ADL Goals Pt Will Perform Grooming: with min guard assist;standing Pt Will Perform Lower Body Dressing: with min guard assist;sit to/from stand Pt Will Transfer to Toilet: with min guard assist;bedside commode;ambulating Pt Will Perform Toileting - Clothing Manipulation and hygiene: with min guard assist;sit to/from stand  OT Frequency: Min 2X/week   Barriers to D/C:            Co-evaluation              AM-PAC PT "6 Clicks" Daily Activity     Outcome Measure Help from another person eating meals?: None Help from another person taking care of personal grooming?: A Little Help from another person toileting, which includes using toliet, bedpan, or urinal?: A Little Help from another person bathing (including washing, rinsing, drying)?: A Little Help from another person to put on and taking off regular upper body clothing?: A Little Help from another person to put on and  taking off regular lower body clothing?: A Little 6 Click Score: 19   End of Session Nurse Communication: Mobility status;Other (comment) (Pt reporting he wants to leave)  Activity Tolerance: Treatment limited secondary to agitation Patient left: in bed;with call bell/phone within reach  OT Visit Diagnosis: Unsteadiness on feet (R26.81);Other abnormalities of gait and mobility (R26.89);Muscle weakness (generalized) (M62.81)                Time: 1610-9604 OT Time Calculation (min): 21 min Charges:  OT General Charges $OT Visit: 1 Procedure OT Evaluation $OT Eval Moderate Complexity: 1 Procedure G-Codes:     Atara Paterson  MSOT, OTR/L Acute Rehab Pager: [phone] Office: [phone]  Theodoro Grist Vladimir Lenhoff 10/02/2016, 4:22 PM

## 2016-10-03 DIAGNOSIS — R451 Restlessness and agitation: Secondary | ICD-10-CM

## 2016-10-03 MED ORDER — HALOPERIDOL LACTATE 5 MG/ML IJ SOLN
2.0000 mg | Freq: Four times a day (QID) | INTRAMUSCULAR | Status: DC | PRN
  Administered 2016-10-03 – 2016-10-06 (×5): 2 mg via INTRAMUSCULAR
  Filled 2016-10-03 (×5): qty 1

## 2016-10-03 NOTE — Progress Notes (Signed)
PROGRESS NOTE                                                                                                                                                                                                             Patient Demographics:    Phillip Mcdowell, is a 36 y.o. male, DOB - 1980/06/11, WUJ:[id number]  Admit date - 09/26/2016   Admitting Physician Standley Brooking, MD  Outpatient Primary MD for the patient is Patient, No Pcp Per  LOS - 7  Outpatient Specialists:None  Chief Complaint  Patient presents with  . Back Pain       Brief Narrative  36 year old male with polysubstance abuse (alcohol, injected heroin with fentanyl) presented to the ED with inability to walk, low back pain, left leg pain and numbness. As per his mother patient has had frequent falls in the past 2 weeks. In the ED workup showed multiple abnormalities including leukocytosis, elevated LFTs, rhabdomyolysis. CT of the head showed abnormal appearance of the temporal horns of the lateral ventricles uncertain for meningitis versus drug use. Patient was transferred to [facility] for further evaluation.    Subjective:   Patient reportedly was very agitated yesterday evening. Countdown after getting IM Haldol. Complains of some pain in his right leg.   Assessment  & Plan :   Principal problem Low back pain with bilateral leg pains, weakness and numbness Pain symptoms are likely associated with rhabdomyolysis andSlowly improving. 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 Also shows satellite lacunar infarcts. Hyperreflexia on exam is likely due to basal ganglia changes. MRI of the cervical spine without abnormal cord or bone marrow signal. Shows C5-C6 foraminal disc protrusion with mild to moderate foraminal stenosis.   PT recommend CIR. insurance authorization pending..  Acute  encephalopathy Possibly secondary to hippocampal infarct. Better oriented  but still having some waxing and waning confusion. 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  EEG with diffuse nonspecific cerebral dysfunction . Neurology consult appreciated.    Left hip joint effusion Suspect inflammatory from synovial fluid analysis. Cultures negative and negative for crystals. No pain or tenderness on hip movement. Discontinued empiric antibiotics.  Acute transaminitis secondary to rhabdomyolysis.  Both LFTs and CPK slowly improving. Continue hydration. Avoid hepatotoxic agents or use with caution.  Hypokalemia  Replenished.   Elevated troponin Secondary to rhabdomyolysis. No chest pain or significant EKG changes.  HCV antibody positive Needs outpatient follow-up.  Possible pancreatitis Elevated lipase with mild retroperitoneal edema and pancreatic thickening. Lipase trended down. No abdominal pain or tenderness. Has active alcohol use.  Dehydration and hyponatremia Improved with IV fluids  Polysubstance use Daily alcohol and marijuana use. Urine toxin positive for amphetamines, opiates and benzodiazepines. Prior IV heroin use. On CIWA  Agitation Seroquel at bedtime and when necessary IM Haldol.  Code Status : Full code  Family Communication  : aunt at bedside  Disposition Plan  : CIR consult  Barriers For Discharge : Improving symptoms, CIR versus SNF upon discharge  Consults  :  Neurology IR  Procedures  : CT head/cervical spine MRI brain EEG 2-D echo Hip aspiration  DVT Prophylaxis  :  Lovenox -  Lab Results  Component Value Date   PLT 262 09/28/2016    Antibiotics  :    Anti-infectives    Start     Dose/Rate Route Frequency Ordered Stop   09/27/16 0000  cefTRIAXone (ROCEPHIN) 2 g in dextrose 5 % 50 mL IVPB  Status:  Discontinued     2 g 100 mL/hr over 30 Minutes Intravenous Every 12 hours 09/26/16 1716 09/28/16 1345    09/26/16 2000  vancomycin (VANCOCIN) IVPB 1000 mg/200 mL premix  Status:  Discontinued     1,000 mg 200 mL/hr over 60 Minutes Intravenous Every 8 hours 09/26/16 1724 09/28/16 1345   09/26/16 1130  cefTRIAXone (ROCEPHIN) 2 g in dextrose 5 % 50 mL IVPB     2 g 100 mL/hr over 30 Minutes Intravenous  Once 09/26/16 1117 09/26/16 1157   09/26/16 1130  vancomycin (VANCOCIN) IVPB 1000 mg/200 mL premix     1,000 mg 200 mL/hr over 60 Minutes Intravenous  Once 09/26/16 1117 09/26/16 1234   09/26/16 1130  acyclovir (ZOVIRAX) 815 mg in dextrose 5 % 150 mL IVPB     10 mg/kg  81.6 kg 166.3 mL/hr over 60 Minutes Intravenous  Once 09/26/16 1119 09/26/16 1355        Objective:   Vitals:   10/02/16 0500 10/02/16 1609 10/02/16 2100 10/03/16 0500  BP: 136/75 (!) 141/79 (!) 144/73 (!) 150/83  Pulse: 78 81 87 80  Resp: 18 18 18 16   Temp: 99.2 F (37.3 C) 98.9 F (37.2 C) 99.6 F (37.6 C) 98.1 F (36.7 C)  TempSrc: Oral Oral Oral Oral  SpO2: 100% 100% 100% 99%  Weight:      Height:        Wt Readings from Last 3 Encounters:  09/26/16 81.6 kg (180 lb)  05/01/15 81.6 kg (180 lb)  09/07/14 83.9 kg (185 lb)     Intake/Output Summary (Last 24 hours) at 10/03/16 1315 Last data filed at 10/03/16 0900  Gross per 24 hour  Intake             1680 ml  Output             1500 ml  Net              180 ml    Physical exam  Gen.: Not in distress HEENT: Moist, supple neck Chest: Clear bilaterally CVS: Normal S1 and S2, no murmurs GI: Soft, nondistended, nontender Musculoskeletal: warm, no edema, report tenderness on pressure over the plantar area, no joint tenderness or swelling Venous: Alert and oriented 2, hyperreflexia over lower extremity  Data Review:    CBC  Recent Labs Lab 09/27/16 0511 09/28/16 1106  WBC 24.3* 11.2*  HGB 18.2* 14.7  HCT 49.9 42.1  PLT 327 262  MCV 92.9 93.6  MCH 33.9 32.7  MCHC 36.5* 34.9  RDW 13.6 13.6  LYMPHSABS 1.3  --   MONOABS 2.1*  --     EOSABS 0.0  --   BASOSABS 0.0  --     Chemistries   Recent Labs Lab 09/27/16 0511 09/28/16 1106 09/30/16 0522 10/01/16 0324 10/02/16 0303  NA 134* 134* 140 141  --   K 4.9 3.9 2.8* 3.5  --   CL 100* 103 106 107  --   CO2 23 23 24 30   --   GLUCOSE 132* 99 148* 105*  --   BUN 11 15 9 9   --   CREATININE 1.15 1.00 0.91 0.84  --   CALCIUM 9.2 8.5* 8.6* 8.9  --   MG  --   --  1.7  --   --   AST 645* 360* 354* 239* 107*  ALT 236* 152* 148* 140* 99*  ALKPHOS 87 55 43 54 48  BILITOT 1.0 0.9 0.9 0.9 0.8   ------------------------------------------------------------------------------------------------------------------ No results for input(s): CHOL, HDL, LDLCALC, TRIG, CHOLHDL, LDLDIRECT in the last 72 hours.  No results found for: HGBA1C ------------------------------------------------------------------------------------------------------------------ No results for input(s): TSH, T4TOTAL, T3FREE, THYROIDAB in the last 72 hours.  Invalid input(s): FREET3 ------------------------------------------------------------------------------------------------------------------ No results for input(s): VITAMINB12, FOLATE, FERRITIN, TIBC, IRON, RETICCTPCT in the last 72 hours.  Coagulation profile No results for input(s): INR, PROTIME in the last 168 hours.  No results for input(s): DDIMER in the last 72 hours.  Cardiac Enzymes  Recent Labs Lab 09/26/16 1545  TROPONINI 0.57*   ------------------------------------------------------------------------------------------------------------------ No results found for: BNP  Inpatient Medications  Scheduled Meds: . folic acid  1 mg Oral Daily  . multivitamin with minerals  1 tablet Oral Daily  . nicotine  21 mg Transdermal Daily  . QUEtiapine  50 mg Oral QHS  . thiamine  100 mg Oral Daily   Continuous Infusions:  PRN Meds:.acetaminophen **OR** acetaminophen, haloperidol, ibuprofen, ondansetron **OR** ondansetron (ZOFRAN) IV  Micro  Results Recent Results (from the past 240 hour(s))  Blood culture (routine x 2)     Status: None   Collection Time: 09/26/16  9:25 AM  Result Value Ref Range Status   Specimen Description   Final    BLOOD BLOOD RIGHT WRIST BOTTLES DRAWN AEROBIC AND ANAEROBIC   Special Requests   Final    Blood Culture results may not be optimal due to an inadequate volume of blood received in culture bottles   Culture NO GROWTH 5 DAYS  Final   Report Status 10/01/2016 FINAL  Final  Anaerobic culture     Status: None   Collection Time: 09/26/16  7:15 PM  Result Value Ref Range Status   Specimen Description SYNOVIAL FLUID LEFT HIP  Final   Special Requests NONE  Final   Culture NO ANAEROBES ISOLATED  Final   Report Status 10/02/2016 FINAL  Final  Body fluid culture     Status: None   Collection Time: 09/26/16  7:15 PM  Result Value Ref Range Status   Specimen Description SYNOVIAL FLUID LEFT HIP  Final   Special Requests NONE  Final   Gram Stain   Final    MODERATE WBC PRESENT, PREDOMINANTLY PMN NO ORGANISMS SEEN    Culture NO GROWTH 3 DAYS  Final  Report Status 09/30/2016 FINAL  Final    Radiology Reports Ct Head Wo Contrast  Result Date: 09/27/2016 CLINICAL DATA:  36 y/o  M; substance abuse and abnormal MRI. EXAM: CT HEAD WITHOUT CONTRAST TECHNIQUE: Contiguous axial images were obtained from the base of the skull through the vertex without intravenous contrast. COMPARISON:  09/27/2016 MRI head. FINDINGS: Brain: Hypoattenuation and mild mass effect of the hippocampi by corresponding to reduced diffusion on prior MRI. Punctate foci of reduced diffusion on MRI are not visible on CT. No evidence for new acute intracranial hemorrhage, infarct, focal mass effect, hydrocephalus, or extra-axial collection. Vascular: No hyperdense vessel or unexpected calcification. Skull: Normal. Negative for fracture or focal lesion. Sinuses/Orbits: No acute finding. Other: None. IMPRESSION: 1. Hypoattenuation and mild mass  effect of the hippocampi corresponding to diffusion abnormality on MRI. 2. No evidence for new acute intracranial hemorrhage, acute infarct, or focal mass effect. Electronically Signed   By: Mitzi Hansen M.D.   On: 09/27/2016 21:40   Ct Head Wo Contrast  Result Date: 09/26/2016 CLINICAL DATA:  Numbness in legs.  Found down. EXAM: CT HEAD WITHOUT CONTRAST CT CERVICAL SPINE WITHOUT CONTRAST TECHNIQUE: Multidetector CT imaging of the head and cervical spine was performed following the standard protocol without intravenous contrast. Multiplanar CT image reconstructions of the cervical spine were also generated. COMPARISON:  None. FINDINGS: CT HEAD FINDINGS Brain: Ventricles are normal in size and configuration. However, temporal horns of the lateral ventricles have an abnormal appearance bilaterally, perhaps indicating adjacent hippocampal edema and/or debris related to underlying meningitis. Remainder of the brain demonstrate normal gray-white matter attenuation. No parenchymal or extra-axial hemorrhage. Vascular: No hyperdense vessel or unexpected calcification. Skull: Normal. Negative for fracture or focal lesion. Sinuses/Orbits: No acute finding. Other: None. CT CERVICAL SPINE FINDINGS Alignment: Dextroscoliosis. No evidence of acute vertebral body subluxation. Skull base and vertebrae: No fracture line or displaced fracture fragment identified. Facet joints appear intact and normal in alignment. Soft tissues and spinal canal: No prevertebral fluid or swelling. No visible canal hematoma. Disc levels: Disc spaces are well preserved throughout. No significant central canal stenosis at any level. Upper chest: Mild emphysematous change at the lung apices. No acute findings. Other: None. IMPRESSION: 1. Temporal horns of the lateral ventricles have an abnormal appearance bilaterally, perhaps indicating adjacent hippocampal edema and/or debris related to underlying meningitis. This appearance can also be  related to drug use. Recommend brain MRI with contrast for further characterization. 2. No fracture or acute subluxation within the cervical spine. Scoliosis. 3. Emphysema. These results were called by telephone at the time of interpretation on 09/26/2016 at 11:11 am to Dr. Margarita Grizzle , who verbally acknowledged these results. Electronically Signed   By: Bary Richard M.D.   On: 09/26/2016 11:12   Ct Cervical Spine Wo Contrast  Result Date: 09/26/2016 CLINICAL DATA:  Numbness in legs.  Found down. EXAM: CT HEAD WITHOUT CONTRAST CT CERVICAL SPINE WITHOUT CONTRAST TECHNIQUE: Multidetector CT imaging of the head and cervical spine was performed following the standard protocol without intravenous contrast. Multiplanar CT image reconstructions of the cervical spine were also generated. COMPARISON:  None. FINDINGS: CT HEAD FINDINGS Brain: Ventricles are normal in size and configuration. However, temporal horns of the lateral ventricles have an abnormal appearance bilaterally, perhaps indicating adjacent hippocampal edema and/or debris related to underlying meningitis. Remainder of the brain demonstrate normal gray-white matter attenuation. No parenchymal or extra-axial hemorrhage. Vascular: No hyperdense vessel or unexpected calcification. Skull: Normal. Negative for fracture or focal  lesion. Sinuses/Orbits: No acute finding. Other: None. CT CERVICAL SPINE FINDINGS Alignment: Dextroscoliosis. No evidence of acute vertebral body subluxation. Skull base and vertebrae: No fracture line or displaced fracture fragment identified. Facet joints appear intact and normal in alignment. Soft tissues and spinal canal: No prevertebral fluid or swelling. No visible canal hematoma. Disc levels: Disc spaces are well preserved throughout. No significant central canal stenosis at any level. Upper chest: Mild emphysematous change at the lung apices. No acute findings. Other: None. IMPRESSION: 1. Temporal horns of the lateral ventricles  have an abnormal appearance bilaterally, perhaps indicating adjacent hippocampal edema and/or debris related to underlying meningitis. This appearance can also be related to drug use. Recommend brain MRI with contrast for further characterization. 2. No fracture or acute subluxation within the cervical spine. Scoliosis. 3. Emphysema. These results were called by telephone at the time of interpretation on 09/26/2016 at 11:11 am to Dr. Margarita Grizzle , who verbally acknowledged these results. Electronically Signed   By: Bary Richard M.D.   On: 09/26/2016 11:12   Ct Thoracic Spine Wo Contrast  Result Date: 09/26/2016 CLINICAL DATA:  Suspected fall yesterday.  Limited history. EXAM: CT THORACIC AND LUMBAR SPINE WITHOUT CONTRAST TECHNIQUE: Multidetector CT imaging of the thoracic and lumbar spine was performed without contrast. Multiplanar CT image reconstructions were also generated. COMPARISON:  None. FINDINGS: CT THORACIC SPINE FINDINGS Alignment: Mild exaggerated kyphosis.  No traumatic malalignment. Vertebrae: Negative for fracture or bone lesion. Paraspinal and other soft tissues: No acute finding. 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 Disc levels: No evidence of degenerative impingement. Ligamentum flavum thickening in ossification at T4-5 and T5-6. CT LUMBAR SPINE FINDINGS Segmentation: 5 lumbar type vertebrae. Alignment: Normal. Vertebrae: No acute fracture or focal pathologic process. Paraspinal and other soft tissues: Mild retroperitoneal edema centered in the central and left upper quadrant. The pancreatic tail appears somewhat thickened and indistinct diffusely. Disc levels: No significant degenerative changes or evidence of impingement. IMPRESSION: CT THORACIC SPINE IMPRESSION No evidence of injury. CT LUMBAR SPINE IMPRESSION 1. No evidence of lumbar spine injury. 2. Mild retroperitoneal edema and pancreatic tail thickening, please correlate for pancreatitis by exam and labs. Electronically Signed   By:  Marnee Spring M.D.   On: 09/26/2016 10:58   Ct Lumbar Spine Wo Contrast  Result Date: 09/26/2016 CLINICAL DATA:  Suspected fall yesterday.  Limited history. EXAM: CT THORACIC AND LUMBAR SPINE WITHOUT CONTRAST TECHNIQUE: Multidetector CT imaging of the thoracic and lumbar spine was performed without contrast. Multiplanar CT image reconstructions were also generated. COMPARISON:  None. FINDINGS: CT THORACIC SPINE FINDINGS Alignment: Mild exaggerated kyphosis.  No traumatic malalignment. Vertebrae: Negative for fracture or bone lesion. Paraspinal and other soft tissues: No acute finding. 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 Disc levels: No evidence of degenerative impingement. Ligamentum flavum thickening in ossification at T4-5 and T5-6. CT LUMBAR SPINE FINDINGS Segmentation: 5 lumbar type vertebrae. Alignment: Normal. Vertebrae: No acute fracture or focal pathologic process. Paraspinal and other soft tissues: Mild retroperitoneal edema centered in the central and left upper quadrant. The pancreatic tail appears somewhat thickened and indistinct diffusely. Disc levels: No significant degenerative changes or evidence of impingement. IMPRESSION: CT THORACIC SPINE IMPRESSION No evidence of injury. CT LUMBAR SPINE IMPRESSION 1. No evidence of lumbar spine injury. 2. Mild retroperitoneal edema and pancreatic tail thickening, please correlate for pancreatitis by exam and labs. Electronically Signed   By: Marnee Spring M.D.   On: 09/26/2016 10:58   Ct Pelvis Wo Contrast  Result  Date: 09/26/2016 CLINICAL DATA:  Bilateral lower extremity numbness. The patient is unable to walk. Possible fall yesterday. Elevated white blood cell count. EXAM: CT PELVIS WITHOUT CONTRAST TECHNIQUE: Multidetector CT imaging of the pelvis was performed following the standard protocol without intravenous contrast. COMPARISON:  None. FINDINGS: Urinary Tract:  Negative. Bowel:  Imaged bowel loops and the appendix appear normal.  Vascular/Lymphatic: No pathologically enlarged lymph nodes. No significant vascular abnormality seen. Reproductive:  Negative. Other:  No intra-abdominal fluid collection. Musculoskeletal: Multifocal muscular edema is identified. Involved muscles include the right gluteus maximus and medius, left gluteus medius and minimus and left obturator externus. Left hip joint effusion is identified. Subcutaneous stranding is seen about the right buttock and left greater trochanter. No bony destructive change or periosteal reaction is identified. No avascular necrosis of the femoral heads. Small synovial herniation pit about both hips are noted. IMPRESSION: Multifocal muscle edema about the pelvis most most worrisome for infectious myositis in this patient with an elevated white blood cell count. Rhabdomyolysis is possible. Left hip joint effusion worrisome for septic joint given elevated white count. No evidence of osteomyelitis. Subcutaneous stranding about the left hip and right buttock likely due to cellulitis. Electronically Signed   By: Drusilla Kanner M.D.   On: 09/26/2016 11:17   Mr Brain Wo Contrast  Addendum Date: 09/27/2016   ADDENDUM REPORT: 09/27/2016 13:59 ADDENDUM: Acute findings discussed with and reconfirmed by Dr.Kirkpatrick, Neurology on 09/27/2016 at 1:45pm. Electronically Signed   By: Awilda Metro M.D.   On: 09/27/2016 13:59   Result Date: 09/27/2016 CLINICAL DATA:  Weakness and pain, follow-up abnormal temporal horns. Evaluate for meningitis. History of polysubstance abuse. EXAM: MRI HEAD WITHOUT CONTRAST TECHNIQUE: Multiplanar, multiecho pulse sequences of the brain and surrounding structures were obtained without intravenous contrast. COMPARISON:  CT HEAD September 26, 2016 FINDINGS: Multiple sequences are mildly or moderately motion degraded. INTRACRANIAL CONTENTS: Symmetric reduced diffusion bilateral hippocampi with low ADC values and bright FLAIR T2 hyperintense signal. Subcentimeter foci of  reduced diffusion bilateral basal ganglia, LEFT cerebellum. No intraventricular reduced diffusion. No abnormal susceptibility artifact to suggest hemorrhage. No midline shift, mass effect or masses. No abnormal extra-axial fluid collections. VASCULAR: Normal major intracranial vascular flow voids present at skull base. SKULL AND UPPER CERVICAL SPINE: No abnormal sellar expansion. No suspicious calvarial bone marrow signal. Craniocervical junction maintained. SINUSES/ORBITS: The mastoid air-cells and included paranasal sinuses are well-aerated.The included ocular globes and orbital contents are non-suspicious. OTHER: None. IMPRESSION: 1. 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 Findings less likely reflect herpes or limbic encephalitis. Additional satellite lacunar infarcts. 2. No imaging findings of meningitis by noncontrast MRI. Electronically Signed: By: Awilda Metro M.D. On: 09/27/2016 13:51   Mr Cervical Spine Wo Contrast  Result Date: 09/29/2016 CLINICAL DATA:  36 y/o M; history of polysubstance abuse presenting with inability to walk, lower back pain, left leg pain and numbness. EXAM: MRI CERVICAL SPINE WITHOUT CONTRAST TECHNIQUE: Multiplanar, multisequence MR imaging of the cervical spine was performed. No intravenous contrast was administered. COMPARISON:  09/26/2016 CT cervical spine. FINDINGS: Motion degraded study. Alignment: Straightening of cervical lordosis.  No listhesis. Vertebrae: No fracture, evidence of discitis, or bone lesion. Cord: Normal signal and morphology. Posterior Fossa, vertebral arteries, paraspinal tissues: Increased T2 signal within the hippocampi by a as seen on prior MRI of the brain. Disc levels: C2-3: No significant disc displacement, foraminal narrowing, or canal stenosis. C3-4: No significant disc displacement, foraminal narrowing, or canal stenosis. C4-5: No significant disc  displacement, foraminal narrowing, or canal stenosis.  C5-6: Right subarticular to foraminal small disc protrusion with mild-to-moderate right foraminal stenosis. C6-7: No significant disc displacement, foraminal narrowing, or canal stenosis. C7-T1: No significant disc displacement, foraminal narrowing, or canal stenosis. IMPRESSION: 1. No abnormal cord or bone marrow signal. 2. C5-6 right subarticular to foraminal disc protrusion with mild-to-moderate right foraminal stenosis. 3. No significant canal stenosis. Electronically Signed   By: Mitzi Hansen M.D.   On: 09/29/2016 19:55   Dg Chest Port 1 View  Result Date: 09/26/2016 CLINICAL DATA:  Pain. EXAM: PORTABLE CHEST 1 VIEW COMPARISON:  None. FINDINGS: Normal heart size and mediastinal contours. No acute infiltrate or edema. No effusion or pneumothorax. No acute osseous findings. IMPRESSION: Negative portable chest. Electronically Signed   By: Marnee Spring M.D.   On: 09/26/2016 09:25   Dg Fluoro Guided Needle Plc Aspiration/injection Loc  Result Date: 09/28/2016 CLINICAL DATA:  Bilateral lower extremity numbness. Left hip fluid noted on recent imaging. History of polysubstance abuse. EXAM: LEFT HIP ASPIRATION UNDER FLUOROSCOPY FLUOROSCOPY TIME:  Fluoroscopy Time:  30 seconds Number of Acquired Spot Images: 3 PROCEDURE: Informed consent was obtained from the patient's parents. The patient's mental status was altered and he was unable to give consent. Left common femoral vein was palpated. Left femoral head and neck region was identified with fluoroscopy. Skin was prepped with Betadine. Sterile field was created. Skin was anesthetized with 1% lidocaine. 20 gauge spinal needle was directed onto the lateral aspect of the left femoral head and neck junction with fluoroscopic guidance. 2 mL of yellow joint fluid was aspirated. Needle was removed without complication. Bandage placed over the puncture site. IMPRESSION: Technically successful left hip aspiration with fluoroscopy. Fluid was sent for  analysis. Electronically Signed   By: Richarda Overlie M.D.   On: 09/28/2016 07:49   US Abdomen Limited Ruq  Result Date: 09/26/2016 CLINICAL DATA:  Initial evaluation for elevated transaminitis. EXAM: ULTRASOUND ABDOMEN LIMITED RIGHT UPPER QUADRANT COMPARISON:  None available. FINDINGS: Gallbladder: Evaluation of the gallbladder somewhat limited due to overlying bowel gas and patient positioning. No definite echogenic stones or sludge identified. Gallbladder wall measure within normal limits a 2.3 mm. No free pericholecystic fluid. No sonographic Murphy sign elicited on exam. Common bile duct: Diameter: 2.9 mm Liver: No focal lesion identified. Within normal limits in parenchymal echogenicity. IMPRESSION: Grossly normal right upper quadrant ultrasound without cholelithiasis, evidence for acute cholecystitis, or biliary dilatation. Please note that evaluation of the gallbladder mildly limited due to shadowing from overlying bowel gas and patient positioning. Electronically Signed   By: Rise Mu M.D.   On: 09/26/2016 21:43    Time Spent in minutes  25   Eddie North M.D on 10/03/2016 at 1:15 PM  Between 7am to 7pm - Pager - [phone]  After 7pm go to [email or web address] - password [facility]  [facility] -  Office  [phone]

## 2016-10-04 ENCOUNTER — Inpatient Hospital Stay (HOSPITAL_COMMUNITY): Payer: Medicaid Other

## 2016-10-04 ENCOUNTER — Encounter (HOSPITAL_COMMUNITY): Payer: Self-pay | Admitting: *Deleted

## 2016-10-04 LAB — HEPATIC FUNCTION PANEL
ALT: 76 U/L — ABNORMAL HIGH (ref 17–63)
AST: 66 U/L — AB (ref 15–41)
Albumin: 3.1 g/dL — ABNORMAL LOW (ref 3.5–5.0)
Alkaline Phosphatase: 47 U/L (ref 38–126)
BILIRUBIN DIRECT: 0.1 mg/dL (ref 0.1–0.5)
BILIRUBIN TOTAL: 1 mg/dL (ref 0.3–1.2)
Indirect Bilirubin: 0.9 mg/dL (ref 0.3–0.9)
Total Protein: 5.4 g/dL — ABNORMAL LOW (ref 6.5–8.1)

## 2016-10-04 LAB — BASIC METABOLIC PANEL
Anion gap: 8 (ref 5–15)
BUN: 13 mg/dL (ref 6–20)
CALCIUM: 8.8 mg/dL — AB (ref 8.9–10.3)
CO2: 25 mmol/L (ref 22–32)
CREATININE: 0.79 mg/dL (ref 0.61–1.24)
Chloride: 106 mmol/L (ref 101–111)
GFR calc non Af Amer: >60 mL/min (ref >60)
Glucose, Bld: 87 mg/dL (ref 65–99)
Potassium: 3.7 mmol/L (ref 3.5–5.1)
Sodium: 139 mmol/L (ref 135–145)

## 2016-10-04 MED ORDER — LORAZEPAM 2 MG/ML IJ SOLN
2.0000 mg | Freq: Four times a day (QID) | INTRAMUSCULAR | Status: DC | PRN
  Administered 2016-10-04 – 2016-10-06 (×5): 2 mg via INTRAVENOUS
  Filled 2016-10-04 (×6): qty 1

## 2016-10-04 NOTE — Progress Notes (Signed)
SLP Cancellation Note  Patient Details Name: Phillip KinderJoshua Quiroa MRN: [id number] DOB: 08/29/1980   Cancelled treatment:       Reason Eval/Treat Not Completed: Patient at procedure or test/unavailable; requested SLE; has baseline deficits, but MRI showed new infarcts   Tressie StalkerPat Shala Baumbach, M.S., CCC-SLP 10/04/2016, 12:15 PM

## 2016-10-04 NOTE — Progress Notes (Signed)
Patient agitated,wanting to get out of bed. Clarified with pharmacist if okay to give oral haldol,IM haldol was given at 1547. Per pharmacist,it's okay to give p.o haldol.Phillip Mcdowell. Lauretta Sallas, Drinda Buttsharito Joselita, RN

## 2016-10-04 NOTE — Progress Notes (Signed)
PROGRESS NOTE                                                                                                                                                                                                             Patient Demographics:    Phillip Mcdowell, is a 36 y.o. male, DOB - 10-09-1980, ZOX:[id number]  Admit date - 09/26/2016   Admitting Physician Standley Brooking, MD  Outpatient Primary MD for the patient is Patient, No Pcp Per  LOS - 8  Outpatient Specialists:None  Chief Complaint  Patient presents with  . Back Pain       Brief Narrative  36 year old male with polysubstance abuse (alcohol, injected heroin with fentanyl) presented to the ED with inability to walk, low back pain, left leg pain and numbness. As per his mother patient has had frequent falls in the past 2 weeks. In the ED workup showed multiple abnormalities including leukocytosis, elevated LFTs, rhabdomyolysis. CT of the head showed abnormal appearance of the temporal horns of the lateral ventricles uncertain for meningitis versus drug use. Patient was transferred to [facility] for further evaluation.    Subjective:   Patient having episodes of restlessness and agitation. Last Haldol dose was given yesterday afternoon.   Assessment  & Plan :   Principal problem Low back pain with bilateral leg pains, weakness and numbness Pain likely secondary to rhabdomyolysis and much improved. 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 Also shows satellite lacunar infarcts. Hyperreflexia on exam is likely due to basal ganglia changes. MRI of the cervical spine without abnormal cord or bone marrow signal. Shows C5-C6 foraminal disc protrusion with mild to moderate foraminal stenosis.   PT recommend CIR. insurance authorization pending..  Acute encephalopathy Possibly secondary to hippocampal  infarct. Better oriented since admission but still having some waxing and waning confusion. 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  EEG with diffuse nonspecific cerebral dysfunction . Neurology consult appreciated.   Patient restless with jerky lower extremity movements this morning. Labs unremarkable, LFTs improved. Check head CT and EKG (check for prolonged QTC). When necessary Haldol and Ativan for agitation/anxiety. No need to repeat EEG.   Left hip joint effusion Suspect inflammatory from synovial fluid analysis. Cultures negative and negative for crystals. No pain or tenderness on  hip movement. Discontinued empiric antibiotics.  Acute transaminitis secondary to rhabdomyolysis.  Both LFTs and CPK improving.  Hypokalemia Replenished.   Elevated troponin Secondary to rhabdomyolysis. No chest pain or significant EKG changes.  HCV antibody positive Needs outpatient follow-up.  Possible pancreatitis Elevated lipase with mild retroperitoneal edema and pancreatic thickening. Lipase trended down. No abdominal pain or tenderness. Has active alcohol use.  Dehydration and hyponatremia Resolved with IV fluids.  Polysubstance use Daily alcohol and marijuana use. Urine toxin positive for amphetamines, opiates and benzodiazepines. Prior IV heroin use. On CIWA  Agitation Seroquel at bedtime and when necessary IM Haldol treating with Ativan. Check head CT.  Code Status : Full code  Family Communication  : aunt at bedside  Disposition Plan  : Pending CIR approval.  Barriers For Discharge : Improving symptoms, CIR versus SNF upon discharge  Consults  :  Neurology IR  Procedures  : CT head/cervical spine MRI brain EEG 2-D echo Hip aspiration  DVT Prophylaxis  :  Lovenox -  Lab Results  Component Value Date   PLT 262 09/28/2016    Antibiotics  :    Anti-infectives    Start     Dose/Rate Route Frequency Ordered Stop   09/27/16 0000   cefTRIAXone (ROCEPHIN) 2 g in dextrose 5 % 50 mL IVPB  Status:  Discontinued     2 g 100 mL/hr over 30 Minutes Intravenous Every 12 hours 09/26/16 1716 09/28/16 1345   09/26/16 2000  vancomycin (VANCOCIN) IVPB 1000 mg/200 mL premix  Status:  Discontinued     1,000 mg 200 mL/hr over 60 Minutes Intravenous Every 8 hours 09/26/16 1724 09/28/16 1345   09/26/16 1130  cefTRIAXone (ROCEPHIN) 2 g in dextrose 5 % 50 mL IVPB     2 g 100 mL/hr over 30 Minutes Intravenous  Once 09/26/16 1117 09/26/16 1157   09/26/16 1130  vancomycin (VANCOCIN) IVPB 1000 mg/200 mL premix     1,000 mg 200 mL/hr over 60 Minutes Intravenous  Once 09/26/16 1117 09/26/16 1234   09/26/16 1130  acyclovir (ZOVIRAX) 815 mg in dextrose 5 % 150 mL IVPB     10 mg/kg  81.6 kg 166.3 mL/hr over 60 Minutes Intravenous  Once 09/26/16 1119 09/26/16 1355        Objective:   Vitals:   10/03/16 0500 10/03/16 1558 10/03/16 2100 10/04/16 0500  BP: (!) 150/83 (!) 145/76 (!) 142/81 (!) 152/85  Pulse: 80 77 71 85  Resp: 16 18 18 16   Temp: 98.1 F (36.7 C) 98.2 F (36.8 C) 98.2 F (36.8 C) 98.5 F (36.9 C)  TempSrc: Oral Oral Oral Oral  SpO2: 99% 98% 100% 98%  Weight:      Height:        Wt Readings from Last 3 Encounters:  09/26/16 81.6 kg (180 lb)  05/01/15 81.6 kg (180 lb)  09/07/14 83.9 kg (185 lb)     Intake/Output Summary (Last 24 hours) at 10/04/16 1147 Last data filed at 10/03/16 1838  Gross per 24 hour  Intake              720 ml  Output                0 ml  Net              720 ml    Physical exam Gen.: Patient restless in bed, communicating poorly today HEENT: Moist mucosa, supple neck Chest: Clear to auscultation bilaterally CVS: Normal S1 and  S2, no murmurs GI: Soft, nondistended, nontender Must reschedule: Warm, no edema, nontender, having jerky lower extremity movements CNS: Alert and oriented 1-2 hyperreflexia of lower extremities bilaterally       Data Review:    CBC  Recent Labs Lab  09/28/16 1106  WBC 11.2*  HGB 14.7  HCT 42.1  PLT 262  MCV 93.6  MCH 32.7  MCHC 34.9  RDW 13.6    Chemistries   Recent Labs Lab 09/28/16 1106 09/30/16 0522 10/01/16 0324 10/02/16 0303 10/04/16 0248  NA 134* 140 141  --  139  K 3.9 2.8* 3.5  --  3.7  CL 103 106 107  --  106  CO2 23 24 30   --  25  GLUCOSE 99 148* 105*  --  87  BUN 15 9 9   --  13  CREATININE 1.00 0.91 0.84  --  0.79  CALCIUM 8.5* 8.6* 8.9  --  8.8*  MG  --  1.7  --   --   --   AST 360* 354* 239* 107* 66*  ALT 152* 148* 140* 99* 76*  ALKPHOS 55 43 54 48 47  BILITOT 0.9 0.9 0.9 0.8 1.0   ------------------------------------------------------------------------------------------------------------------ No results for input(s): CHOL, HDL, LDLCALC, TRIG, CHOLHDL, LDLDIRECT in the last 72 hours.  No results found for: HGBA1C ------------------------------------------------------------------------------------------------------------------ No results for input(s): TSH, T4TOTAL, T3FREE, THYROIDAB in the last 72 hours.  Invalid input(s): FREET3 ------------------------------------------------------------------------------------------------------------------ No results for input(s): VITAMINB12, FOLATE, FERRITIN, TIBC, IRON, RETICCTPCT in the last 72 hours.  Coagulation profile No results for input(s): INR, PROTIME in the last 168 hours.  No results for input(s): DDIMER in the last 72 hours.  Cardiac Enzymes No results for input(s): CKMB, TROPONINI, MYOGLOBIN in the last 168 hours.  Invalid input(s): CK ------------------------------------------------------------------------------------------------------------------ No results found for: BNP  Inpatient Medications  Scheduled Meds: . folic acid  1 mg Oral Daily  . multivitamin with minerals  1 tablet Oral Daily  . nicotine  21 mg Transdermal Daily  . QUEtiapine  50 mg Oral QHS  . thiamine  100 mg Oral Daily   Continuous Infusions:  PRN  Meds:.acetaminophen **OR** acetaminophen, haloperidol, haloperidol lactate, ibuprofen, LORazepam, ondansetron **OR** ondansetron (ZOFRAN) IV  Micro Results Recent Results (from the past 240 hour(s))  Blood culture (routine x 2)     Status: None   Collection Time: 09/26/16  9:25 AM  Result Value Ref Range Status   Specimen Description   Final    BLOOD BLOOD RIGHT WRIST BOTTLES DRAWN AEROBIC AND ANAEROBIC   Special Requests   Final    Blood Culture results may not be optimal due to an inadequate volume of blood received in culture bottles   Culture NO GROWTH 5 DAYS  Final   Report Status 10/01/2016 FINAL  Final  Anaerobic culture     Status: None   Collection Time: 09/26/16  7:15 PM  Result Value Ref Range Status   Specimen Description SYNOVIAL FLUID LEFT HIP  Final   Special Requests NONE  Final   Culture NO ANAEROBES ISOLATED  Final   Report Status 10/02/2016 FINAL  Final  Body fluid culture     Status: None   Collection Time: 09/26/16  7:15 PM  Result Value Ref Range Status   Specimen Description SYNOVIAL FLUID LEFT HIP  Final   Special Requests NONE  Final   Gram Stain   Final    MODERATE WBC PRESENT, PREDOMINANTLY PMN NO ORGANISMS SEEN    Culture  NO GROWTH 3 DAYS  Final   Report Status 09/30/2016 FINAL  Final    Radiology Reports Ct Head Wo Contrast  Result Date: 09/27/2016 CLINICAL DATA:  36 y/o  M; substance abuse and abnormal MRI. EXAM: CT HEAD WITHOUT CONTRAST TECHNIQUE: Contiguous axial images were obtained from the base of the skull through the vertex without intravenous contrast. COMPARISON:  09/27/2016 MRI head. FINDINGS: Brain: Hypoattenuation and mild mass effect of the hippocampi by corresponding to reduced diffusion on prior MRI. Punctate foci of reduced diffusion on MRI are not visible on CT. No evidence for new acute intracranial hemorrhage, infarct, focal mass effect, hydrocephalus, or extra-axial collection. Vascular: No hyperdense vessel or unexpected  calcification. Skull: Normal. Negative for fracture or focal lesion. Sinuses/Orbits: No acute finding. Other: None. IMPRESSION: 1. Hypoattenuation and mild mass effect of the hippocampi corresponding to diffusion abnormality on MRI. 2. No evidence for new acute intracranial hemorrhage, acute infarct, or focal mass effect. Electronically Signed   By: Mitzi Hansen M.D.   On: 09/27/2016 21:40   Ct Head Wo Contrast  Result Date: 09/26/2016 CLINICAL DATA:  Numbness in legs.  Found down. EXAM: CT HEAD WITHOUT CONTRAST CT CERVICAL SPINE WITHOUT CONTRAST TECHNIQUE: Multidetector CT imaging of the head and cervical spine was performed following the standard protocol without intravenous contrast. Multiplanar CT image reconstructions of the cervical spine were also generated. COMPARISON:  None. FINDINGS: CT HEAD FINDINGS Brain: Ventricles are normal in size and configuration. However, temporal horns of the lateral ventricles have an abnormal appearance bilaterally, perhaps indicating adjacent hippocampal edema and/or debris related to underlying meningitis. Remainder of the brain demonstrate normal gray-white matter attenuation. No parenchymal or extra-axial hemorrhage. Vascular: No hyperdense vessel or unexpected calcification. Skull: Normal. Negative for fracture or focal lesion. Sinuses/Orbits: No acute finding. Other: None. CT CERVICAL SPINE FINDINGS Alignment: Dextroscoliosis. No evidence of acute vertebral body subluxation. Skull base and vertebrae: No fracture line or displaced fracture fragment identified. Facet joints appear intact and normal in alignment. Soft tissues and spinal canal: No prevertebral fluid or swelling. No visible canal hematoma. Disc levels: Disc spaces are well preserved throughout. No significant central canal stenosis at any level. Upper chest: Mild emphysematous change at the lung apices. No acute findings. Other: None. IMPRESSION: 1. Temporal horns of the lateral ventricles have  an abnormal appearance bilaterally, perhaps indicating adjacent hippocampal edema and/or debris related to underlying meningitis. This appearance can also be related to drug use. Recommend brain MRI with contrast for further characterization. 2. No fracture or acute subluxation within the cervical spine. Scoliosis. 3. Emphysema. These results were called by telephone at the time of interpretation on 09/26/2016 at 11:11 am to Dr. Margarita Grizzle , who verbally acknowledged these results. Electronically Signed   By: Bary Richard M.D.   On: 09/26/2016 11:12   Ct Cervical Spine Wo Contrast  Result Date: 09/26/2016 CLINICAL DATA:  Numbness in legs.  Found down. EXAM: CT HEAD WITHOUT CONTRAST CT CERVICAL SPINE WITHOUT CONTRAST TECHNIQUE: Multidetector CT imaging of the head and cervical spine was performed following the standard protocol without intravenous contrast. Multiplanar CT image reconstructions of the cervical spine were also generated. COMPARISON:  None. FINDINGS: CT HEAD FINDINGS Brain: Ventricles are normal in size and configuration. However, temporal horns of the lateral ventricles have an abnormal appearance bilaterally, perhaps indicating adjacent hippocampal edema and/or debris related to underlying meningitis. Remainder of the brain demonstrate normal gray-white matter attenuation. No parenchymal or extra-axial hemorrhage. Vascular: No hyperdense vessel or unexpected  calcification. Skull: Normal. Negative for fracture or focal lesion. Sinuses/Orbits: No acute finding. Other: None. CT CERVICAL SPINE FINDINGS Alignment: Dextroscoliosis. No evidence of acute vertebral body subluxation. Skull base and vertebrae: No fracture line or displaced fracture fragment identified. Facet joints appear intact and normal in alignment. Soft tissues and spinal canal: No prevertebral fluid or swelling. No visible canal hematoma. Disc levels: Disc spaces are well preserved throughout. No significant central canal stenosis at  any level. Upper chest: Mild emphysematous change at the lung apices. No acute findings. Other: None. IMPRESSION: 1. Temporal horns of the lateral ventricles have an abnormal appearance bilaterally, perhaps indicating adjacent hippocampal edema and/or debris related to underlying meningitis. This appearance can also be related to drug use. Recommend brain MRI with contrast for further characterization. 2. No fracture or acute subluxation within the cervical spine. Scoliosis. 3. Emphysema. These results were called by telephone at the time of interpretation on 09/26/2016 at 11:11 am to Dr. Margarita Grizzle , who verbally acknowledged these results. Electronically Signed   By: Bary Richard M.D.   On: 09/26/2016 11:12   Ct Thoracic Spine Wo Contrast  Result Date: 09/26/2016 CLINICAL DATA:  Suspected fall yesterday.  Limited history. EXAM: CT THORACIC AND LUMBAR SPINE WITHOUT CONTRAST TECHNIQUE: Multidetector CT imaging of the thoracic and lumbar spine was performed without contrast. Multiplanar CT image reconstructions were also generated. COMPARISON:  None. FINDINGS: CT THORACIC SPINE FINDINGS Alignment: Mild exaggerated kyphosis.  No traumatic malalignment. Vertebrae: Negative for fracture or bone lesion. Paraspinal and other soft tissues: No acute finding. 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 Disc levels: No evidence of degenerative impingement. Ligamentum flavum thickening in ossification at T4-5 and T5-6. CT LUMBAR SPINE FINDINGS Segmentation: 5 lumbar type vertebrae. Alignment: Normal. Vertebrae: No acute fracture or focal pathologic process. Paraspinal and other soft tissues: Mild retroperitoneal edema centered in the central and left upper quadrant. The pancreatic tail appears somewhat thickened and indistinct diffusely. Disc levels: No significant degenerative changes or evidence of impingement. IMPRESSION: CT THORACIC SPINE IMPRESSION No evidence of injury. CT LUMBAR SPINE IMPRESSION 1. No evidence of lumbar  spine injury. 2. Mild retroperitoneal edema and pancreatic tail thickening, please correlate for pancreatitis by exam and labs. Electronically Signed   By: Marnee Spring M.D.   On: 09/26/2016 10:58   Ct Lumbar Spine Wo Contrast  Result Date: 09/26/2016 CLINICAL DATA:  Suspected fall yesterday.  Limited history. EXAM: CT THORACIC AND LUMBAR SPINE WITHOUT CONTRAST TECHNIQUE: Multidetector CT imaging of the thoracic and lumbar spine was performed without contrast. Multiplanar CT image reconstructions were also generated. COMPARISON:  None. FINDINGS: CT THORACIC SPINE FINDINGS Alignment: Mild exaggerated kyphosis.  No traumatic malalignment. Vertebrae: Negative for fracture or bone lesion. Paraspinal and other soft tissues: No acute finding. 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 Disc levels: No evidence of degenerative impingement. Ligamentum flavum thickening in ossification at T4-5 and T5-6. CT LUMBAR SPINE FINDINGS Segmentation: 5 lumbar type vertebrae. Alignment: Normal. Vertebrae: No acute fracture or focal pathologic process. Paraspinal and other soft tissues: Mild retroperitoneal edema centered in the central and left upper quadrant. The pancreatic tail appears somewhat thickened and indistinct diffusely. Disc levels: No significant degenerative changes or evidence of impingement. IMPRESSION: CT THORACIC SPINE IMPRESSION No evidence of injury. CT LUMBAR SPINE IMPRESSION 1. No evidence of lumbar spine injury. 2. Mild retroperitoneal edema and pancreatic tail thickening, please correlate for pancreatitis by exam and labs. Electronically Signed   By: Marnee Spring M.D.   On: 09/26/2016 10:58  Ct Pelvis Wo Contrast  Result Date: 09/26/2016 CLINICAL DATA:  Bilateral lower extremity numbness. The patient is unable to walk. Possible fall yesterday. Elevated white blood cell count. EXAM: CT PELVIS WITHOUT CONTRAST TECHNIQUE: Multidetector CT imaging of the pelvis was performed following the standard protocol  without intravenous contrast. COMPARISON:  None. FINDINGS: Urinary Tract:  Negative. Bowel:  Imaged bowel loops and the appendix appear normal. Vascular/Lymphatic: No pathologically enlarged lymph nodes. No significant vascular abnormality seen. Reproductive:  Negative. Other:  No intra-abdominal fluid collection. Musculoskeletal: Multifocal muscular edema is identified. Involved muscles include the right gluteus maximus and medius, left gluteus medius and minimus and left obturator externus. Left hip joint effusion is identified. Subcutaneous stranding is seen about the right buttock and left greater trochanter. No bony destructive change or periosteal reaction is identified. No avascular necrosis of the femoral heads. Small synovial herniation pit about both hips are noted. IMPRESSION: Multifocal muscle edema about the pelvis most most worrisome for infectious myositis in this patient with an elevated white blood cell count. Rhabdomyolysis is possible. Left hip joint effusion worrisome for septic joint given elevated white count. No evidence of osteomyelitis. Subcutaneous stranding about the left hip and right buttock likely due to cellulitis. Electronically Signed   By: Drusilla Kannerhomas  Dalessio M.D.   On: 09/26/2016 11:17   Mr Brain Wo Contrast  Addendum Date: 09/27/2016   ADDENDUM REPORT: 09/27/2016 13:59 ADDENDUM: Acute findings discussed with and reconfirmed by Dr.Kirkpatrick, Neurology on 09/27/2016 at 1:45pm. Electronically Signed   By: Awilda Metroourtnay  Bloomer M.D.   On: 09/27/2016 13:59   Result Date: 09/27/2016 CLINICAL DATA:  Weakness and pain, follow-up abnormal temporal horns. Evaluate for meningitis. History of polysubstance abuse. EXAM: MRI HEAD WITHOUT CONTRAST TECHNIQUE: Multiplanar, multiecho pulse sequences of the brain and surrounding structures were obtained without intravenous contrast. COMPARISON:  CT HEAD September 26, 2016 FINDINGS: Multiple sequences are mildly or moderately motion degraded. INTRACRANIAL  CONTENTS: Symmetric reduced diffusion bilateral hippocampi with low ADC values and bright FLAIR T2 hyperintense signal. Subcentimeter foci of reduced diffusion bilateral basal ganglia, LEFT cerebellum. No intraventricular reduced diffusion. No abnormal susceptibility artifact to suggest hemorrhage. No midline shift, mass effect or masses. No abnormal extra-axial fluid collections. VASCULAR: Normal major intracranial vascular flow voids present at skull base. SKULL AND UPPER CERVICAL SPINE: No abnormal sellar expansion. No suspicious calvarial bone marrow signal. Craniocervical junction maintained. SINUSES/ORBITS: The mastoid air-cells and included paranasal sinuses are well-aerated.The included ocular globes and orbital contents are non-suspicious. OTHER: None. IMPRESSION: 1. 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 Findings less likely reflect herpes or limbic encephalitis. Additional satellite lacunar infarcts. 2. No imaging findings of meningitis by noncontrast MRI. Electronically Signed: By: Awilda Metroourtnay  Bloomer M.D. On: 09/27/2016 13:51   Mr Cervical Spine Wo Contrast  Result Date: 09/29/2016 CLINICAL DATA:  36 y/o M; history of polysubstance abuse presenting with inability to walk, lower back pain, left leg pain and numbness. EXAM: MRI CERVICAL SPINE WITHOUT CONTRAST TECHNIQUE: Multiplanar, multisequence MR imaging of the cervical spine was performed. No intravenous contrast was administered. COMPARISON:  09/26/2016 CT cervical spine. FINDINGS: Motion degraded study. Alignment: Straightening of cervical lordosis.  No listhesis. Vertebrae: No fracture, evidence of discitis, or bone lesion. Cord: Normal signal and morphology. Posterior Fossa, vertebral arteries, paraspinal tissues: Increased T2 signal within the hippocampi by a as seen on prior MRI of the brain. Disc levels: C2-3: No significant disc displacement, foraminal narrowing, or canal stenosis. C3-4: No  significant disc displacement, foraminal narrowing,  or canal stenosis. C4-5: No significant disc displacement, foraminal narrowing, or canal stenosis. C5-6: Right subarticular to foraminal small disc protrusion with mild-to-moderate right foraminal stenosis. C6-7: No significant disc displacement, foraminal narrowing, or canal stenosis. C7-T1: No significant disc displacement, foraminal narrowing, or canal stenosis. IMPRESSION: 1. No abnormal cord or bone marrow signal. 2. C5-6 right subarticular to foraminal disc protrusion with mild-to-moderate right foraminal stenosis. 3. No significant canal stenosis. Electronically Signed   By: Mitzi Hansen M.D.   On: 09/29/2016 19:55   Dg Chest Port 1 View  Result Date: 09/26/2016 CLINICAL DATA:  Pain. EXAM: PORTABLE CHEST 1 VIEW COMPARISON:  None. FINDINGS: Normal heart size and mediastinal contours. No acute infiltrate or edema. No effusion or pneumothorax. No acute osseous findings. IMPRESSION: Negative portable chest. Electronically Signed   By: Marnee Spring M.D.   On: 09/26/2016 09:25   Dg Fluoro Guided Needle Plc Aspiration/injection Loc  Result Date: 09/28/2016 CLINICAL DATA:  Bilateral lower extremity numbness. Left hip fluid noted on recent imaging. History of polysubstance abuse. EXAM: LEFT HIP ASPIRATION UNDER FLUOROSCOPY FLUOROSCOPY TIME:  Fluoroscopy Time:  30 seconds Number of Acquired Spot Images: 3 PROCEDURE: Informed consent was obtained from the patient's parents. The patient's mental status was altered and he was unable to give consent. Left common femoral vein was palpated. Left femoral head and neck region was identified with fluoroscopy. Skin was prepped with Betadine. Sterile field was created. Skin was anesthetized with 1% lidocaine. 20 gauge spinal needle was directed onto the lateral aspect of the left femoral head and neck junction with fluoroscopic guidance. 2 mL of yellow joint fluid was aspirated. Needle was removed without  complication. Bandage placed over the puncture site. IMPRESSION: Technically successful left hip aspiration with fluoroscopy. Fluid was sent for analysis. Electronically Signed   By: Richarda Overlie M.D.   On: 09/28/2016 07:49   US Abdomen Limited Ruq  Result Date: 09/26/2016 CLINICAL DATA:  Initial evaluation for elevated transaminitis. EXAM: ULTRASOUND ABDOMEN LIMITED RIGHT UPPER QUADRANT COMPARISON:  None available. FINDINGS: Gallbladder: Evaluation of the gallbladder somewhat limited due to overlying bowel gas and patient positioning. No definite echogenic stones or sludge identified. Gallbladder wall measure within normal limits a 2.3 mm. No free pericholecystic fluid. No sonographic Murphy sign elicited on exam. Common bile duct: Diameter: 2.9 mm Liver: No focal lesion identified. Within normal limits in parenchymal echogenicity. IMPRESSION: Grossly normal right upper quadrant ultrasound without cholelithiasis, evidence for acute cholecystitis, or biliary dilatation. Please note that evaluation of the gallbladder mildly limited due to shadowing from overlying bowel gas and patient positioning. Electronically Signed   By: Rise Mu M.D.   On: 09/26/2016 21:43    Time Spent in minutes  25   Eddie North M.D on 10/04/2016 at 11:47 AM  Between 7am to 7pm - Pager - [phone]  After 7pm go to [email or web address] - password [facility]  [facility] -  Office  [phone]

## 2016-10-04 NOTE — Progress Notes (Addendum)
Physical Therapy Treatment Patient Details Name: Phillip Mcdowell MRN: [id number] DOB: 09/19/1980 Today's Date: 10/04/2016    History of Present Illness Pt is 36 y.o. male admitted to ED with polysubstance abuse (alcohol, injected heroin with fentanyl) on 09/26/16 with inability to walk, low back pain, left leg pain and numbness. Pelvic CT shows muscle edema worrisome for infectious myositis; rhabdomyolysis possible. MRI 8/6 shows bilat hippocampi infarc assoc with durg overdose, and additional satellite lacunar infarcts. 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 Pertinent PMH includes bipolar, depression.   PT Comments    Pt progressing with PT, agreeable and motivated to work today. Amb in hallway with RW and minA to prevent increased sway and LOB; required repeated multimodal cues for gait mechanics, including clearing RLE with foot drop (at times requiring manual correction to straighten R foot), ensuring wide BOS,and safe technique with RW. Continues to demonstrate decreased safety awareness and impulsivity, requiring multiple cues throughout session to slow down transfers and amb to prevent LOB; significant increase in fall risk secondary to this. Discussed importance of continued rehab upon d/c and pt agreeable to this. Will continue to follow acutely.   Follow Up Recommendations  CIR;Supervision/Assistance - 24 hour     Equipment Recommendations  Rolling walker with 5" wheels    Recommendations for Other Services       Precautions / Restrictions Precautions Precautions: Fall Restrictions Weight Bearing Restrictions: No    Mobility  Bed Mobility Overal bed mobility: Needs Assistance Bed Mobility: Sit to Supine       Sit to supine: Min guard   General bed mobility comments: Sitting at EOB with NT upon arrival. Min guard for safety to return to supine.  Transfers Overall transfer level: Needs assistance Equipment  used: Rolling walker (2 wheeled) Transfers: Sit to/from Stand Sit to Stand: Min guard         General transfer comment: Stood x3 for bathing and don/doffing clothes at bedside with RW and min guard for safety. 1x uncontrolled descent to bed secondary to impulsive moving while donning clothes. Educ on importance of using RW, activity pacing, and safety.  Ambulation/Gait Ambulation/Gait assistance: Min assist Ambulation Distance (Feet): 50 Feet Assistive device: Rolling walker (2 wheeled) Gait Pattern/deviations: Step-to pattern;Step-through pattern;Decreased step length - left;Decreased weight shift to right;Decreased dorsiflexion - right;Trendelenburg;Steppage;Narrow base of support Gait velocity: Decreased Gait velocity interpretation: <1.8 ft/sec, indicative of risk for recurrent falls General Gait Details: Amb with RW and minA for balance; intermittent minA to adjust RLE and assist with steps. Cues for technique and safety with RW. Required repeated cues for amb technique, having a WOB, and stepping enough with RLE to clear R foot drop.    Stairs            Wheelchair Mobility    Modified Rankin (Stroke Patients Only) Modified Rankin (Stroke Patients Only) Pre-Morbid Rankin Score: No symptoms Modified Rankin: Moderately severe disability     Balance Overall balance assessment: Needs assistance Sitting-balance support: No upper extremity supported;Feet supported Sitting balance-Leahy Scale: Fair     Standing balance support: During functional activity;No upper extremity supported Standing balance-Leahy Scale: Poor Standing balance comment: Able to stand at toilet with no UE support and minA for standing balance. Reliant on BUE support for amb                            Cognition Arousal/Alertness: Awake/alert Behavior During Therapy:  Impulsive Overall Cognitive Status: Impaired/Different from baseline Area of Impairment: Attention;Memory;Following  commands;Safety/judgement;Problem solving;Awareness;Orientation                 Orientation Level: Disoriented to;Time (Month, day) Current Attention Level: Sustained Memory: Decreased short-term memory Following Commands: Follows multi-step commands inconsistently Safety/Judgement: Decreased awareness of safety;Decreased awareness of deficits Awareness: Emergent Problem Solving: Requires verbal cues;Requires tactile cues General Comments: Pt less agitated today and agreeable to idea of further rehab at d/c. Remains impulsive with movement, requiring multimodal cues for safety with transfers and amb. Requires reorientation to task and directions for activity.      Exercises      General Comments General comments (skin integrity, edema, etc.): Aunt present throughout session      Pertinent Vitals/Pain Pain Assessment: 0-10 Pain Score: 4  Pain Location: R leg Pain Descriptors / Indicators: Aching Pain Intervention(s): Limited activity within patient's tolerance;Monitored during session    Home Living                      Prior Function            PT Goals (current goals can now be found in the care plan section) Acute Rehab PT Goals Patient Stated Goal: Return home PT Goal Formulation: With patient Time For Goal Achievement: 10/15/16 Potential to Achieve Goals: Good Progress towards PT goals: Progressing toward goals    Frequency    Min 4X/week      PT Plan Current plan remains appropriate    Co-evaluation              AM-PAC PT "6 Clicks" Daily Activity  Outcome Measure  Difficulty turning over in bed (including adjusting bedclothes, sheets and blankets)?: None Difficulty moving from lying on back to sitting on the side of the bed? : Total Difficulty sitting down on and standing up from a chair with arms (e.g., wheelchair, bedside commode, etc,.)?: Total Help needed moving to and from a bed to chair (including a wheelchair)?: A  Little Help needed walking in hospital room?: A Little Help needed climbing 3-5 steps with a railing? : A Little 6 Click Score: 15    End of Session Equipment Utilized During Treatment: Gait belt Activity Tolerance: Patient tolerated treatment well Patient left: in bed;with call bell/phone within reach Nurse Communication: Mobility status PT Visit Diagnosis: Unsteadiness on feet (R26.81);Muscle weakness (generalized) (M62.81);Other symptoms and signs involving the nervous system (R29.898)     Time: 1100-1130 PT Time Calculation (min) (ACUTE ONLY): 30 min  Charges:  $Gait Training: 23-37 mins                    G Codes:      Ina HomesJaclyn Lewie Deman, PT, DPT [phone] Acute Rehab  Malachy ChamberJaclyn L Kalya Troeger 10/04/2016, 12:02 PM

## 2016-10-04 NOTE — Progress Notes (Signed)
I met with pt and his Aunt at bedside to discuss his rehab needs. Pt is aware he is hospitalized due to drugs and a stroke. Wants to discharge home for feels he can do better there. Aunt at bedside uses oxygen and states she and her Mom are at home with him, but limited in assistance they can provide due to their health problems. Aunt reports pt ambulates to bathroom with walker and she stands to assist him, he is easily agitated and they are giving him meds through his IV to keep him calm. Aunt trying to coax him to remain in hospital, but pt states wants to go home. I would recommend home with [name] and family support or SNF if felt unable to manage pt at home. Pt continues to receive Ativan and Haldol for agitation. I will update RN CM. [phone]

## 2016-10-04 NOTE — Progress Notes (Signed)
   10/04/16 1800  Clinical Encounter Type  Visited With Patient and family together;Health care provider  Visit Type Initial;Spiritual support;Social support  Referral From Nurse  Consult/Referral To Chaplain  Spiritual Encounters  Spiritual Needs Literature;Prayer;Emotional  Stress Factors  Patient Stress Factors Family relationships;Health changes;Lack of knowledge  Family Stress Factors Exhausted;Family relationships;Financial concerns;Health changes;Lack of knowledge    Chaplain responded to page from nurse [id number]. Explained those are done during the day. However, family was on edge due to previous event with significant other. Facilitated family meeting and explained that patient was competent to make own medical decisions until a doctor deems otherwise. Patient was engaged and understood conversation. He forgot some things, but otherwise was tracking with conversation. Aunt and mother wanted a general POA to address issues with patient's children. I explained that would be something different. Patient does not wish to complete an ADHCPOA or name his mother his POA. Met with patient privately and he discussed family of origin, drug history and other emotional issues. Provided emotional support, education, ministry of presence and prayer. Starlyn Droge L. Volanda Napoleon, MDiv

## 2016-10-05 DIAGNOSIS — F17210 Nicotine dependence, cigarettes, uncomplicated: Secondary | ICD-10-CM

## 2016-10-05 DIAGNOSIS — F1220 Cannabis dependence, uncomplicated: Secondary | ICD-10-CM

## 2016-10-05 DIAGNOSIS — Z9114 Patient's other noncompliance with medication regimen: Secondary | ICD-10-CM

## 2016-10-05 DIAGNOSIS — M21371 Foot drop, right foot: Secondary | ICD-10-CM

## 2016-10-05 DIAGNOSIS — T404X2A Poisoning by other synthetic narcotics, intentional self-harm, initial encounter: Secondary | ICD-10-CM

## 2016-10-05 MED ORDER — QUETIAPINE FUMARATE 50 MG PO TABS
50.0000 mg | ORAL_TABLET | Freq: Three times a day (TID) | ORAL | Status: DC
Start: 2016-10-05 — End: 2016-10-06
  Administered 2016-10-05 – 2016-10-06 (×3): 50 mg via ORAL
  Filled 2016-10-05 (×4): qty 1

## 2016-10-05 NOTE — Progress Notes (Signed)
Occupational Therapy Treatment Patient Details Name: Phillip Mcdowell MRN: [id number] DOB: 11-23-1980 Today's Date: 10/05/2016    History of present illness Pt is 36 y.o. male admitted to ED with polysubstance abuse (alcohol, injected heroin with fentanyl) on 09/26/16 with inability to walk, low back pain, left leg pain and numbness. Pelvic CT shows muscle edema worrisome for infectious myositis; rhabdomyolysis possible. MRI 8/6 shows bilat hippocampi infarc assoc with durg overdose, and additional satellite lacunar infarcts. 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 Pertinent PMH includes bipolar, depression.   OT comments  Pt progressing towards established goals and was more willing to participate in therapy. Used AFO to assist with R foot drop. Provided education on tub transfer with shower chair. Pt required Min A +2 and Mod VCs to sequence task. Update dc plan to SNF for further OT to optimize pt safety and independence with ADLs and functional mobility prior to transitioning home. Will continue to follow acutely to facilitate safe dc.    Follow Up Recommendations  Supervision/Assistance - 24 hour;SNF    Equipment Recommendations  Other (comment) (Defer to next venue)    Recommendations for Other Services PT consult    Precautions / Restrictions Precautions Precautions: Fall Restrictions Weight Bearing Restrictions: No       Mobility Bed Mobility Overal bed mobility: Needs Assistance Bed Mobility: Supine to Sit     Supine to sit: Min guard Sit to supine: Min guard   General bed mobility comments: Min guard for safety with transfers and cues to slow impulsiveness.  Transfers Overall transfer level: Needs assistance Equipment used: Rolling walker (2 wheeled) Transfers: Sit to/from Stand Sit to Stand: Min guard         General transfer comment: Pt requiring Min Guard for safety due to decreased safety  awarness and balance    Balance Overall balance assessment: Needs assistance Sitting-balance support: No upper extremity supported;Feet supported Sitting balance-Leahy Scale: Fair     Standing balance support: During functional activity;No upper extremity supported Standing balance-Leahy Scale: Poor Standing balance comment: Increased sway with no UE support; reliant on BUE support for amb                           ADL either performed or assessed with clinical judgement   ADL Overall ADL's : Needs assistance/impaired Eating/Feeding: Set up;Sitting   Grooming: Set up;Sitting               Lower Body Dressing: Minimal assistance;Sit to/from stand Lower Body Dressing Details (indicate cue type and reason): Min A to don R sock over AFO.         Tub/ Shower Transfer: Tub transfer;Shower seat;Minimal assistance;+2 for physical assistance;Cueing for sequencing;Cueing for safety;3 in 1;Rolling walker Tub/Shower Transfer Details (indicate cue type and reason): Provided education on safe tub transfer. Pt performed transfer with Min A +2 and VCs throughout to sequence task. Use AFO on R foot to assist with foot drop.  Functional mobility during ADLs: Minimal assistance;Rolling walker General ADL Comments: Pt performed tub transfer and functional mobility with AFO for R foot drop. contiues to require VCs and tactile cues for sequencing due to poor cogntition.      Vision       Perception     Praxis      Cognition Arousal/Alertness: Awake/alert Behavior During Therapy: WFL for tasks assessed/performed Overall Cognitive Status: Impaired/Different from baseline Area of Impairment: Attention;Following  commands;Problem solving;Safety/judgement;Awareness                 Orientation Level: Disoriented to;Time (Month, day) Current Attention Level: Sustained Memory: Decreased short-term memory Following Commands: Follows multi-step commands  inconsistently Safety/Judgement: Decreased awareness of safety;Decreased awareness of deficits Awareness: Emergent Problem Solving: Requires verbal cues;Requires tactile cues General Comments: Pt with decreased agitation this session, but continues to require multiple cues for safety throughout treatment. Mentioned he fell back on the bed yesterday after "trying to do too much myself, even though I know I should be asking for help". Reports agreement with need for further rehab before returning home        Exercises     Shoulder Instructions       General Comments Aunt present throughout session    Pertinent Vitals/ Pain       Pain Assessment: Faces Pain Score: 6  Faces Pain Scale: Hurts even more Pain Location: R leg Pain Descriptors / Indicators: Aching;Sore Pain Intervention(s): Monitored during session;Limited activity within patient's tolerance;Repositioned  Home Living                                          Prior Functioning/Environment              Frequency  Min 2X/week        Progress Toward Goals  OT Goals(current goals can now be found in the care plan section)  Progress towards OT goals: Progressing toward goals  Acute Rehab OT Goals Patient Stated Goal: Return home OT Goal Formulation: With patient Time For Goal Achievement: 10/16/16 Potential to Achieve Goals: Good ADL Goals Pt Will Perform Grooming: with min guard assist;standing Pt Will Perform Lower Body Dressing: with min guard assist;sit to/from stand Pt Will Transfer to Toilet: with min guard assist;bedside commode;ambulating Pt Will Perform Toileting - Clothing Manipulation and hygiene: with min guard assist;sit to/from stand  Plan Discharge plan needs to be updated    Co-evaluation    PT/OT/SLP Co-Evaluation/Treatment: Yes Reason for Co-Treatment: Necessary to address cognition/behavior during functional activity PT goals addressed during session: Mobility/safety  with mobility;Balance;Proper use of DME OT goals addressed during session: ADL's and self-care      AM-PAC PT "6 Clicks" Daily Activity     Outcome Measure   Help from another person eating meals?: None Help from another person taking care of personal grooming?: A Little Help from another person toileting, which includes using toliet, bedpan, or urinal?: A Little Help from another person bathing (including washing, rinsing, drying)?: A Little Help from another person to put on and taking off regular upper body clothing?: A Little Help from another person to put on and taking off regular lower body clothing?: A Little 6 Click Score: 19    End of Session Equipment Utilized During Treatment: Gait belt;Rolling walker;Other (comment) (AFO)  OT Visit Diagnosis: Unsteadiness on feet (R26.81);Other abnormalities of gait and mobility (R26.89);Muscle weakness (generalized) (M62.81)   Activity Tolerance Patient limited by lethargy   Patient Left with call bell/phone within reach;in chair;with chair alarm set;with family/visitor present   Nurse Communication Mobility status        Time: [id number] OT Time Calculation (min): 31 min  Charges: OT General Charges $OT Visit: 1 Procedure OT Treatments $Self Care/Home Management : 8-22 mins  Serenity Batley MSOT, OTR/L Acute Rehab Pager: [phone] Office: [phone]   Theodoro GristCharis M Sione Baumgarten 10/05/2016,  1:49 PM

## 2016-10-05 NOTE — Progress Notes (Signed)
PROGRESS NOTE                                                                                                                                                                                                             Phillip Mcdowell Demographics:    Phillip Mcdowell, is a 36 y.o. male, DOB - August 16, 1980, ZOX:[id number]  Admit date - 09/26/2016   Admitting Physician Standley Brooking, MD  Outpatient Primary MD for the Phillip Mcdowell is Phillip Mcdowell, No Pcp Per  LOS - 9  Outpatient Specialists:None  Chief Complaint  Phillip Mcdowell presents with  . Back Pain       Brief Narrative  36 year old male with polysubstance abuse (alcohol, injected heroin with fentanyl) presented to the ED with inability to walk, low back pain, left leg pain and numbness. As per his mother Phillip Mcdowell has had frequent falls in the past 2 weeks. In the ED workup showed multiple abnormalities including leukocytosis, elevated LFTs, rhabdomyolysis. CT of the head showed abnormal appearance of the temporal horns of the lateral ventricles uncertain for meningitis versus drug use. Phillip Mcdowell was transferred to [facility] for further evaluation.    Subjective:   Phillip Mcdowell has episodes of waxing and waning mental status with episodes of confusion and agitation, threatening to leave or asking to go out and get fresh air or 2 smoke.   Assessment  & Plan :   Principal problem Low back pain with bilateral leg pains, weakness and numbness Pain likely secondary to rhabdomyolysis , much improved now. 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 Also shows satellite lacunar infarcts. Hyperreflexia on exam is likely due to basal ganglia changes. MRI of the cervical spine without abnormal cord or bone marrow signal. Shows C5-C6 foraminal disc protrusion with mild to moderate foraminal stenosis.  PT recommended she had reevaluated and  recommend SNF or home with home health. Given his fall risk and inability to process instructions clearly he is not so safe to go home with home health.  Acute encephalopathy Possibly secondary to hippocampal infarct. Better oriented since admission but still having some waxing and waning confusion and episodes of agitation. Reportedly he does have history of bipolar disorder and not on medications.. 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  EEG with diffuse nonspecific cerebral dysfunction . Neurology consult  appreciated.   Repeat head CT on 8/13 (done for jerky movements of his lower extremities) shows no acute findings and interval improvement in hypoattenuation seen in the hippocampi.  Psych consult appreciated (note pending but discussed with Dr. Elsie Saas) . Phillip Mcdowell deemed not competent to make complex medical decisions. Phillip Mcdowell is on Seroquel once a day for agitation, recommended increasing dose to 50 mg 3 times a day. Continue when necessary Haldol and Ativan for agitation.   Left hip joint effusion Suspect inflammatory from synovial fluid analysis. Cultures negative and negative for crystals. No pain or tenderness on hip movement. Discontinued empiric antibiotics.  Acute transaminitis secondary to rhabdomyolysis.  Both LFTs and CPK improved.  Right foot drop Ordered dynamic AFO.  Hypokalemia Replenished.   Elevated troponin Secondary to rhabdomyolysis. No chest pain or significant EKG changes.  HCV antibody positive Needs outpatient follow-up.  Possible pancreatitis Elevated lipase with mild retroperitoneal edema and pancreatic thickening. Lipase trended down. No abdominal pain or tenderness. Has active alcohol use.  Dehydration and hyponatremia Resolved with IV fluids.  Polysubstance use Daily alcohol and marijuana use. Urine toxin positive for amphetamines, opiates and benzodiazepines. Prior IV heroin use. On CIWA    Code Status :  Full code  Family Communication  : aunt at bedside  Disposition Plan  : Needs SNF  Barriers For Discharge : SNF when available, [facility] pending  Consults  :  Neurology IR  Procedures  : CT head/cervical spine MRI brain EEG 2-D echo Hip aspiration  DVT Prophylaxis  :  Lovenox -  Lab Results  Component Value Date   PLT 262 09/28/2016    Antibiotics  :    Anti-infectives    Start     Dose/Rate Route Frequency Ordered Stop   09/27/16 0000  cefTRIAXone (ROCEPHIN) 2 g in dextrose 5 % 50 mL IVPB  Status:  Discontinued     2 g 100 mL/hr over 30 Minutes Intravenous Every 12 hours 09/26/16 1716 09/28/16 1345   09/26/16 2000  vancomycin (VANCOCIN) IVPB 1000 mg/200 mL premix  Status:  Discontinued     1,000 mg 200 mL/hr over 60 Minutes Intravenous Every 8 hours 09/26/16 1724 09/28/16 1345   09/26/16 1130  cefTRIAXone (ROCEPHIN) 2 g in dextrose 5 % 50 mL IVPB     2 g 100 mL/hr over 30 Minutes Intravenous  Once 09/26/16 1117 09/26/16 1157   09/26/16 1130  vancomycin (VANCOCIN) IVPB 1000 mg/200 mL premix     1,000 mg 200 mL/hr over 60 Minutes Intravenous  Once 09/26/16 1117 09/26/16 1234   09/26/16 1130  acyclovir (ZOVIRAX) 815 mg in dextrose 5 % 150 mL IVPB     10 mg/kg  81.6 kg 166.3 mL/hr over 60 Minutes Intravenous  Once 09/26/16 1119 09/26/16 1355        Objective:   Vitals:   10/04/16 0500 10/04/16 1413 10/04/16 1900 10/05/16 0408  BP: (!) 152/85 140/79 132/83 127/80  Pulse: 85 95 79 82  Resp: 16 17 18 16   Temp: 98.5 F (36.9 C) 98 F (36.7 C) 98.6 F (37 C) 98.7 F (37.1 C)  TempSrc: Oral Oral Axillary Axillary  SpO2: 98% 98% 99% 98%  Weight:      Height:        Wt Readings from Last 3 Encounters:  09/26/16 81.6 kg (180 lb)  05/01/15 81.6 kg (180 lb)  09/07/14 83.9 kg (185 lb)     Intake/Output Summary (Last 24 hours) at 10/05/16 1635 Last data filed at  10/05/16 0830  Gross per 24 hour  Intake             1020 ml  Output             1175 ml  Net              -155 ml    Physical exam Gen.: Not in distress HEENT: Moist mucosa, supple neck Chest: Clear bilaterally CVS: Normal S1 and S2, no murmurs GI: Soft, nondistended, nontender Musculoskeletal: Warm, no edema, right foot drop CNS: Alert and oriented 2, hyperreflexia of lower extremity bilaterally (improved)       Data Review:    CBC No results for input(s): WBC, HGB, HCT, PLT, MCV, MCH, MCHC, RDW, LYMPHSABS, MONOABS, EOSABS, BASOSABS, BANDABS in the last 168 hours.  Invalid input(s): NEUTRABS, BANDSABD  Chemistries   Recent Labs Lab 09/30/16 0522 10/01/16 0324 10/02/16 0303 10/04/16 0248  NA 140 141  --  139  K 2.8* 3.5  --  3.7  CL 106 107  --  106  CO2 24 30  --  25  GLUCOSE 148* 105*  --  87  BUN 9 9  --  13  CREATININE 0.91 0.84  --  0.79  CALCIUM 8.6* 8.9  --  8.8*  MG 1.7  --   --   --   AST 354* 239* 107* 66*  ALT 148* 140* 99* 76*  ALKPHOS 43 54 48 47  BILITOT 0.9 0.9 0.8 1.0   ------------------------------------------------------------------------------------------------------------------ No results for input(s): CHOL, HDL, LDLCALC, TRIG, CHOLHDL, LDLDIRECT in the last 72 hours.  No results found for: HGBA1C ------------------------------------------------------------------------------------------------------------------ No results for input(s): TSH, T4TOTAL, T3FREE, THYROIDAB in the last 72 hours.  Invalid input(s): FREET3 ------------------------------------------------------------------------------------------------------------------ No results for input(s): VITAMINB12, FOLATE, FERRITIN, TIBC, IRON, RETICCTPCT in the last 72 hours.  Coagulation profile No results for input(s): INR, PROTIME in the last 168 hours.  No results for input(s): DDIMER in the last 72 hours.  Cardiac Enzymes No results for input(s): CKMB, TROPONINI, MYOGLOBIN in the last 168 hours.  Invalid input(s):  CK ------------------------------------------------------------------------------------------------------------------ No results found for: BNP  Inpatient Medications  Scheduled Meds: . folic acid  1 mg Oral Daily  . multivitamin with minerals  1 tablet Oral Daily  . nicotine  21 mg Transdermal Daily  . QUEtiapine  50 mg Oral TID  . thiamine  100 mg Oral Daily   Continuous Infusions:  PRN Meds:.acetaminophen **OR** acetaminophen, haloperidol, haloperidol lactate, ibuprofen, LORazepam, ondansetron **OR** ondansetron (ZOFRAN) IV  Micro Results Recent Results (from the past 240 hour(s))  Blood culture (routine x 2)     Status: None   Collection Time: 09/26/16  9:25 AM  Result Value Ref Range Status   Specimen Description   Final    BLOOD BLOOD RIGHT WRIST BOTTLES DRAWN AEROBIC AND ANAEROBIC   Special Requests   Final    Blood Culture results may not be optimal due to an inadequate volume of blood received in culture bottles   Culture NO GROWTH 5 DAYS  Final   Report Status 10/01/2016 FINAL  Final  Anaerobic culture     Status: None   Collection Time: 09/26/16  7:15 PM  Result Value Ref Range Status   Specimen Description SYNOVIAL FLUID LEFT HIP  Final   Special Requests NONE  Final   Culture NO ANAEROBES ISOLATED  Final   Report Status 10/02/2016 FINAL  Final  Body fluid culture     Status: None  Collection Time: 09/26/16  7:15 PM  Result Value Ref Range Status   Specimen Description SYNOVIAL FLUID LEFT HIP  Final   Special Requests NONE  Final   Gram Stain   Final    MODERATE WBC PRESENT, PREDOMINANTLY PMN NO ORGANISMS SEEN    Culture NO GROWTH 3 DAYS  Final   Report Status 09/30/2016 FINAL  Final    Radiology Reports Ct Head Wo Contrast  Result Date: 10/04/2016 CLINICAL DATA:  Altered mental status EXAM: CT HEAD WITHOUT CONTRAST TECHNIQUE: Contiguous axial images were obtained from the base of the skull through the vertex without intravenous contrast. Sagittal and  coronal MPR images reconstructed from axial data set. COMPARISON:  09/27/2016 FINDINGS: Brain: Mild generalized atrophy for age. Normal ventricular morphology. No midline shift or mass effect. Otherwise normal appearance of brain parenchyma. Hypoattenuation within the hippocampi on the previous exam improved. No intracranial hemorrhage, mass lesion or evidence of acute infarction. Vascular: Unremarkable Skull: Normal appearance Sinuses/Orbits: Clear Other: N/A IMPRESSION: No acute intracranial abnormalities. Interval improvement of the hypoattenuation seen previously within the hippocampi. Electronically Signed   By: Ulyses Southward M.D.   On: 10/04/2016 12:25   Ct Head Wo Contrast  Result Date: 09/27/2016 CLINICAL DATA:  36 y/o  M; substance abuse and abnormal MRI. EXAM: CT HEAD WITHOUT CONTRAST TECHNIQUE: Contiguous axial images were obtained from the base of the skull through the vertex without intravenous contrast. COMPARISON:  09/27/2016 MRI head. FINDINGS: Brain: Hypoattenuation and mild mass effect of the hippocampi by corresponding to reduced diffusion on prior MRI. Punctate foci of reduced diffusion on MRI are not visible on CT. No evidence for new acute intracranial hemorrhage, infarct, focal mass effect, hydrocephalus, or extra-axial collection. Vascular: No hyperdense vessel or unexpected calcification. Skull: Normal. Negative for fracture or focal lesion. Sinuses/Orbits: No acute finding. Other: None. IMPRESSION: 1. Hypoattenuation and mild mass effect of the hippocampi corresponding to diffusion abnormality on MRI. 2. No evidence for new acute intracranial hemorrhage, acute infarct, or focal mass effect. Electronically Signed   By: Mitzi Hansen M.D.   On: 09/27/2016 21:40   Ct Head Wo Contrast  Result Date: 09/26/2016 CLINICAL DATA:  Numbness in legs.  Found down. EXAM: CT HEAD WITHOUT CONTRAST CT CERVICAL SPINE WITHOUT CONTRAST TECHNIQUE: Multidetector CT imaging of the head and cervical  spine was performed following the standard protocol without intravenous contrast. Multiplanar CT image reconstructions of the cervical spine were also generated. COMPARISON:  None. FINDINGS: CT HEAD FINDINGS Brain: Ventricles are normal in size and configuration. However, temporal horns of the lateral ventricles have an abnormal appearance bilaterally, perhaps indicating adjacent hippocampal edema and/or debris related to underlying meningitis. Remainder of the brain demonstrate normal gray-white matter attenuation. No parenchymal or extra-axial hemorrhage. Vascular: No hyperdense vessel or unexpected calcification. Skull: Normal. Negative for fracture or focal lesion. Sinuses/Orbits: No acute finding. Other: None. CT CERVICAL SPINE FINDINGS Alignment: Dextroscoliosis. No evidence of acute vertebral body subluxation. Skull base and vertebrae: No fracture line or displaced fracture fragment identified. Facet joints appear intact and normal in alignment. Soft tissues and spinal canal: No prevertebral fluid or swelling. No visible canal hematoma. Disc levels: Disc spaces are well preserved throughout. No significant central canal stenosis at any level. Upper chest: Mild emphysematous change at the lung apices. No acute findings. Other: None. IMPRESSION: 1. Temporal horns of the lateral ventricles have an abnormal appearance bilaterally, perhaps indicating adjacent hippocampal edema and/or debris related to underlying meningitis. This appearance can also be related  to drug use. Recommend brain MRI with contrast for further characterization. 2. No fracture or acute subluxation within the cervical spine. Scoliosis. 3. Emphysema. These results were called by telephone at the time of interpretation on 09/26/2016 at 11:11 am to Dr. Margarita Grizzle , who verbally acknowledged these results. Electronically Signed   By: Bary Richard M.D.   On: 09/26/2016 11:12   Ct Cervical Spine Wo Contrast  Result Date: 09/26/2016 CLINICAL  DATA:  Numbness in legs.  Found down. EXAM: CT HEAD WITHOUT CONTRAST CT CERVICAL SPINE WITHOUT CONTRAST TECHNIQUE: Multidetector CT imaging of the head and cervical spine was performed following the standard protocol without intravenous contrast. Multiplanar CT image reconstructions of the cervical spine were also generated. COMPARISON:  None. FINDINGS: CT HEAD FINDINGS Brain: Ventricles are normal in size and configuration. However, temporal horns of the lateral ventricles have an abnormal appearance bilaterally, perhaps indicating adjacent hippocampal edema and/or debris related to underlying meningitis. Remainder of the brain demonstrate normal gray-white matter attenuation. No parenchymal or extra-axial hemorrhage. Vascular: No hyperdense vessel or unexpected calcification. Skull: Normal. Negative for fracture or focal lesion. Sinuses/Orbits: No acute finding. Other: None. CT CERVICAL SPINE FINDINGS Alignment: Dextroscoliosis. No evidence of acute vertebral body subluxation. Skull base and vertebrae: No fracture line or displaced fracture fragment identified. Facet joints appear intact and normal in alignment. Soft tissues and spinal canal: No prevertebral fluid or swelling. No visible canal hematoma. Disc levels: Disc spaces are well preserved throughout. No significant central canal stenosis at any level. Upper chest: Mild emphysematous change at the lung apices. No acute findings. Other: None. IMPRESSION: 1. Temporal horns of the lateral ventricles have an abnormal appearance bilaterally, perhaps indicating adjacent hippocampal edema and/or debris related to underlying meningitis. This appearance can also be related to drug use. Recommend brain MRI with contrast for further characterization. 2. No fracture or acute subluxation within the cervical spine. Scoliosis. 3. Emphysema. These results were called by telephone at the time of interpretation on 09/26/2016 at 11:11 am to Dr. Margarita Grizzle , who verbally  acknowledged these results. Electronically Signed   By: Bary Richard M.D.   On: 09/26/2016 11:12   Ct Thoracic Spine Wo Contrast  Result Date: 09/26/2016 CLINICAL DATA:  Suspected fall yesterday.  Limited history. EXAM: CT THORACIC AND LUMBAR SPINE WITHOUT CONTRAST TECHNIQUE: Multidetector CT imaging of the thoracic and lumbar spine was performed without contrast. Multiplanar CT image reconstructions were also generated. COMPARISON:  None. FINDINGS: CT THORACIC SPINE FINDINGS Alignment: Mild exaggerated kyphosis.  No traumatic malalignment. Vertebrae: Negative for fracture or bone lesion. Paraspinal and other soft tissues: No acute finding. 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 Disc levels: No evidence of degenerative impingement. Ligamentum flavum thickening in ossification at T4-5 and T5-6. CT LUMBAR SPINE FINDINGS Segmentation: 5 lumbar type vertebrae. Alignment: Normal. Vertebrae: No acute fracture or focal pathologic process. Paraspinal and other soft tissues: Mild retroperitoneal edema centered in the central and left upper quadrant. The pancreatic tail appears somewhat thickened and indistinct diffusely. Disc levels: No significant degenerative changes or evidence of impingement. IMPRESSION: CT THORACIC SPINE IMPRESSION No evidence of injury. CT LUMBAR SPINE IMPRESSION 1. No evidence of lumbar spine injury. 2. Mild retroperitoneal edema and pancreatic tail thickening, please correlate for pancreatitis by exam and labs. Electronically Signed   By: Marnee Spring M.D.   On: 09/26/2016 10:58   Ct Lumbar Spine Wo Contrast  Result Date: 09/26/2016 CLINICAL DATA:  Suspected fall yesterday.  Limited history. EXAM: CT THORACIC AND  LUMBAR SPINE WITHOUT CONTRAST TECHNIQUE: Multidetector CT imaging of the thoracic and lumbar spine was performed without contrast. Multiplanar CT image reconstructions were also generated. COMPARISON:  None. FINDINGS: CT THORACIC SPINE FINDINGS Alignment: Mild exaggerated kyphosis.   No traumatic malalignment. Vertebrae: Negative for fracture or bone lesion. Paraspinal and other soft tissues: No acute finding. 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 Disc levels: No evidence of degenerative impingement. Ligamentum flavum thickening in ossification at T4-5 and T5-6. CT LUMBAR SPINE FINDINGS Segmentation: 5 lumbar type vertebrae. Alignment: Normal. Vertebrae: No acute fracture or focal pathologic process. Paraspinal and other soft tissues: Mild retroperitoneal edema centered in the central and left upper quadrant. The pancreatic tail appears somewhat thickened and indistinct diffusely. Disc levels: No significant degenerative changes or evidence of impingement. IMPRESSION: CT THORACIC SPINE IMPRESSION No evidence of injury. CT LUMBAR SPINE IMPRESSION 1. No evidence of lumbar spine injury. 2. Mild retroperitoneal edema and pancreatic tail thickening, please correlate for pancreatitis by exam and labs. Electronically Signed   By: Marnee Spring M.D.   On: 09/26/2016 10:58   Ct Pelvis Wo Contrast  Result Date: 09/26/2016 CLINICAL DATA:  Bilateral lower extremity numbness. The Phillip Mcdowell is unable to walk. Possible fall yesterday. Elevated white blood cell count. EXAM: CT PELVIS WITHOUT CONTRAST TECHNIQUE: Multidetector CT imaging of the pelvis was performed following the standard protocol without intravenous contrast. COMPARISON:  None. FINDINGS: Urinary Tract:  Negative. Bowel:  Imaged bowel loops and the appendix appear normal. Vascular/Lymphatic: No pathologically enlarged lymph nodes. No significant vascular abnormality seen. Reproductive:  Negative. Other:  No intra-abdominal fluid collection. Musculoskeletal: Multifocal muscular edema is identified. Involved muscles include the right gluteus maximus and medius, left gluteus medius and minimus and left obturator externus. Left hip joint effusion is identified. Subcutaneous stranding is seen about the right buttock and left greater trochanter. No  bony destructive change or periosteal reaction is identified. No avascular necrosis of the femoral heads. Small synovial herniation pit about both hips are noted. IMPRESSION: Multifocal muscle edema about the pelvis most most worrisome for infectious myositis in this Phillip Mcdowell with an elevated white blood cell count. Rhabdomyolysis is possible. Left hip joint effusion worrisome for septic joint given elevated white count. No evidence of osteomyelitis. Subcutaneous stranding about the left hip and right buttock likely due to cellulitis. Electronically Signed   By: Drusilla Kanner M.D.   On: 09/26/2016 11:17   Mr Brain Wo Contrast  Addendum Date: 09/27/2016   ADDENDUM REPORT: 09/27/2016 13:59 ADDENDUM: Acute findings discussed with and reconfirmed by Dr.Kirkpatrick, Neurology on 09/27/2016 at 1:45pm. Electronically Signed   By: Awilda Metro M.D.   On: 09/27/2016 13:59   Result Date: 09/27/2016 CLINICAL DATA:  Weakness and pain, follow-up abnormal temporal horns. Evaluate for meningitis. History of polysubstance abuse. EXAM: MRI HEAD WITHOUT CONTRAST TECHNIQUE: Multiplanar, multiecho pulse sequences of the brain and surrounding structures were obtained without intravenous contrast. COMPARISON:  CT HEAD September 26, 2016 FINDINGS: Multiple sequences are mildly or moderately motion degraded. INTRACRANIAL CONTENTS: Symmetric reduced diffusion bilateral hippocampi with low ADC values and bright FLAIR T2 hyperintense signal. Subcentimeter foci of reduced diffusion bilateral basal ganglia, LEFT cerebellum. No intraventricular reduced diffusion. No abnormal susceptibility artifact to suggest hemorrhage. No midline shift, mass effect or masses. No abnormal extra-axial fluid collections. VASCULAR: Normal major intracranial vascular flow voids present at skull base. SKULL AND UPPER CERVICAL SPINE: No abnormal sellar expansion. No suspicious calvarial bone marrow signal. Craniocervical junction maintained. SINUSES/ORBITS: The  mastoid air-cells and included paranasal  sinuses are well-aerated.The included ocular globes and orbital contents are non-suspicious. OTHER: None. IMPRESSION: 1. 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 Findings less likely reflect herpes or limbic encephalitis. Additional satellite lacunar infarcts. 2. No imaging findings of meningitis by noncontrast MRI. Electronically Signed: By: Awilda Metro M.D. On: 09/27/2016 13:51   Mr Cervical Spine Wo Contrast  Result Date: 09/29/2016 CLINICAL DATA:  36 y/o M; history of polysubstance abuse presenting with inability to walk, lower back pain, left leg pain and numbness. EXAM: MRI CERVICAL SPINE WITHOUT CONTRAST TECHNIQUE: Multiplanar, multisequence MR imaging of the cervical spine was performed. No intravenous contrast was administered. COMPARISON:  09/26/2016 CT cervical spine. FINDINGS: Motion degraded study. Alignment: Straightening of cervical lordosis.  No listhesis. Vertebrae: No fracture, evidence of discitis, or bone lesion. Cord: Normal signal and morphology. Posterior Fossa, vertebral arteries, paraspinal tissues: Increased T2 signal within the hippocampi by a as seen on prior MRI of the brain. Disc levels: C2-3: No significant disc displacement, foraminal narrowing, or canal stenosis. C3-4: No significant disc displacement, foraminal narrowing, or canal stenosis. C4-5: No significant disc displacement, foraminal narrowing, or canal stenosis. C5-6: Right subarticular to foraminal small disc protrusion with mild-to-moderate right foraminal stenosis. C6-7: No significant disc displacement, foraminal narrowing, or canal stenosis. C7-T1: No significant disc displacement, foraminal narrowing, or canal stenosis. IMPRESSION: 1. No abnormal cord or bone marrow signal. 2. C5-6 right subarticular to foraminal disc protrusion with mild-to-moderate right foraminal stenosis. 3. No significant canal stenosis. Electronically  Signed   By: Mitzi Hansen M.D.   On: 09/29/2016 19:55   Dg Chest Port 1 View  Result Date: 09/26/2016 CLINICAL DATA:  Pain. EXAM: PORTABLE CHEST 1 VIEW COMPARISON:  None. FINDINGS: Normal heart size and mediastinal contours. No acute infiltrate or edema. No effusion or pneumothorax. No acute osseous findings. IMPRESSION: Negative portable chest. Electronically Signed   By: Marnee Spring M.D.   On: 09/26/2016 09:25   Dg Fluoro Guided Needle Plc Aspiration/injection Loc  Result Date: 09/28/2016 CLINICAL DATA:  Bilateral lower extremity numbness. Left hip fluid noted on recent imaging. History of polysubstance abuse. EXAM: LEFT HIP ASPIRATION UNDER FLUOROSCOPY FLUOROSCOPY TIME:  Fluoroscopy Time:  30 seconds Number of Acquired Spot Images: 3 PROCEDURE: Informed consent was obtained from the Phillip Mcdowell's parents. The Phillip Mcdowell's mental status was altered and he was unable to give consent. Left common femoral vein was palpated. Left femoral head and neck region was identified with fluoroscopy. Skin was prepped with Betadine. Sterile field was created. Skin was anesthetized with 1% lidocaine. 20 gauge spinal needle was directed onto the lateral aspect of the left femoral head and neck junction with fluoroscopic guidance. 2 mL of yellow joint fluid was aspirated. Needle was removed without complication. Bandage placed over the puncture site. IMPRESSION: Technically successful left hip aspiration with fluoroscopy. Fluid was sent for analysis. Electronically Signed   By: Richarda Overlie M.D.   On: 09/28/2016 07:49   US Abdomen Limited Ruq  Result Date: 09/26/2016 CLINICAL DATA:  Initial evaluation for elevated transaminitis. EXAM: ULTRASOUND ABDOMEN LIMITED RIGHT UPPER QUADRANT COMPARISON:  None available. FINDINGS: Gallbladder: Evaluation of the gallbladder somewhat limited due to overlying bowel gas and Phillip Mcdowell positioning. No definite echogenic stones or sludge identified. Gallbladder wall measure within  normal limits a 2.3 mm. No free pericholecystic fluid. No sonographic Murphy sign elicited on exam. Common bile duct: Diameter: 2.9 mm Liver: No focal lesion identified. Within normal limits in parenchymal echogenicity. IMPRESSION: Grossly normal right upper quadrant  ultrasound without cholelithiasis, evidence for acute cholecystitis, or biliary dilatation. Please note that evaluation of the gallbladder mildly limited due to shadowing from overlying bowel gas and Phillip Mcdowell positioning. Electronically Signed   By: Rise MuBenjamin  McClintock M.D.   On: 09/26/2016 21:43    Time Spent in minutes  25   Eddie NorthHUNGEL, Tyona Nilsen M.D on 10/05/2016 at 4:35 PM  Between 7am to 7pm - Pager - [phone]  After 7pm go to [email or web address] - password [email or web address]  [facility] -  Office  [phone]

## 2016-10-05 NOTE — Progress Notes (Signed)
Orthopedic Tech Progress Note Patient Details:  Phillip BumpersJoshua Mcdowell 09/22/1980 [id number]  Ortho Devices Type of Ortho Device:  (prafo boot) Ortho Device/Splint Location: rle Ortho Device/Splint Interventions: Application   Phillip Mcdowell 10/05/2016, 11:58 AM

## 2016-10-05 NOTE — Progress Notes (Signed)
Patient states he wanted to leave AMA if he could not smoke a cigarette. I asked physician if he could leave the unit with his aunt in order to smoke, Dr. Gonzella Lexhungel states that he cannot. I informed patient that it is AMA for him to leave and that he would have to sign an AMA form. He stated that he would sign the form but that he wanted to leave only to smoke. Form was signed and patient left unit with aunt and RN (for patient's safety). Patient was off unit for approximately 8 minutes. AMA form on chart.

## 2016-10-05 NOTE — Progress Notes (Signed)
  Speech Language Pathology Treatment: Dysphagia  Patient Details Name: Phillip Mcdowell MRN: [id number] DOB: 05-13-1980 Today's Date: 10/05/2016 Time: 2637-8588 SLP Time Calculation (min) (ACUTE ONLY): 18 min  Assessment / Plan / Recommendation Clinical Impression  Bedside swallow assessment reordered however pt is already on caseload for dysphagia (prior SLP requested order for cognitive assessment). Oral and pharyngeal stages were within normal limits. Pt reported globus sensation "about every other day." SLP educated re: strategies to mitigate GERD symptoms. Aunt may discuss if pt would benefit from reflux meds with MD. Phillip Mcdowell states pt's memory is poor. Will request order for cognitive evaluation. Aunt would like pt to go to CIR however CIR admission coordinator recommend home with [facility] and family support or SNF if felt unable to manage pt at home.     HPI HPI: Pt is 36 y.o. male admitted to ED with polysubstance abuse (alcohol, injected heroin with fentanyl) on 09/26/16 with inability to walk, low back pain, left leg pain and numbness. Pelvic CT shows muscle edema worrisome for infectious myositis; rhabdomyolysis possible. MRI 8/6 shows bilat hippocampi infarc assoc with durg overdose, and additional satellite lacunar infarcts. 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 Pertinent PMH includes bipolar, depression. BSE 8/10 with concerns for GER, recommended regular/thin liquids. Aunt states "sometimes coughs with liquids."      SLP Plan  All goals met       Recommendations  Diet recommendations: Regular;Thin liquid Liquids provided via: Cup;Straw Medication Administration: Whole meds with liquid Supervision: Patient able to self feed Compensations: Slow rate;Small sips/bites;Follow solids with liquid Postural Changes and/or Swallow Maneuvers: Seated upright 90 degrees;Upright 30-60 min after meal                General  recommendations: Rehab consult Oral Care Recommendations: Oral care BID Follow up Recommendations: Inpatient Rehab SLP Visit Diagnosis: Dysphagia, unspecified (R13.10) Plan: All goals met       GO                Houston Siren 10/05/2016, 10:34 AM  Orbie Pyo Colvin Caroli.Ed [employer] [phone]

## 2016-10-05 NOTE — NC FL2 (Signed)
[place] MEDICAID FL2 LEVEL OF CARE SCREENING TOOL     IDENTIFICATION  Patient Name: Phillip BumpersJoshua XXXMartin Birthdate: 01/10/1981 Sex: male Admission Date (Current Location): 09/26/2016  [facility] [place]   Facility and Address:  [facility], [address], [place], [place] [address]      Provider Number: [id number]  Attending Physician Name and Address:  Eddie Northhungel, Nishant, MD  Relative Name and Phone Number:    Mom Ms. Justice BritainLisa Stevens [phone] Hyman BibleAunt Janet Goins  [phone]    Current Level of Care:   Recommended Level of Care:   Prior Approval Number:    Date Approved/Denied: 10/05/16 PASRR Number: pending  Discharge Plan: SNF    Current Diagnoses: Patient Active Problem List   Diagnosis Date Noted  . Hip pain, acute, left   . Leukocytosis   . Bipolar affective disorder (HCC)   . Schizophrenia (HCC)   . Hypokalemia   . Lacunar stroke (HCC)   . Foraminal stenosis of cervical region   . Abnormal EKG   . Abnormal head CT   . Acute encephalopathy   . Bilateral leg pain 09/26/2016  . Back pain 09/26/2016  . Leg numbness 09/26/2016  . Effusion of hip joint, left 09/26/2016  . Myositis 09/26/2016  . Abnormal finding on MRI of brain 09/26/2016  . Pancreatitis 09/26/2016  . Dehydration 09/26/2016  . Hyponatremia 09/26/2016  . Elevated transaminase level 09/26/2016  . Elevated troponin 09/26/2016  . Polysubstance abuse 09/26/2016    Orientation RESPIRATION BLADDER Height & Weight     Self, Place  Normal Continent Weight: 180 lb (81.6 kg) Height:  6\' 1"  (185.4 cm)  BEHAVIORAL SYMPTOMS/MOOD NEUROLOGICAL BOWEL NUTRITION STATUS      Continent Diet  AMBULATORY STATUS COMMUNICATION OF NEEDS Skin   Limited Assist Verbally Normal                       Personal Care Assistance Level of Assistance  Bathing, Feeding, Dressing Bathing Assistance: Limited assistance Feeding assistance: Independent Dressing Assistance:  Limited assistance     Functional Limitations Info             SPECIAL CARE FACTORS FREQUENCY  PT (By licensed PT), OT (By licensed OT)     PT Frequency: 4x week OT Frequency: 2x week            Contractures      Additional Factors Info  Code Status, Allergies, Psychotropic Code Status Info: Full Code Allergies Info: PENICILLINS, SULFA ANTIBIOTICS Psychotropic Info: Seroquel         Current Medications (10/05/2016):  This is the current hospital active medication list Current Facility-Administered Medications  Medication Dose Route Frequency Provider Last Rate Last Dose  . acetaminophen (TYLENOL) tablet 650 mg  650 mg Oral Q6H PRN Standley BrookingGoodrich, Daniel P, MD   650 mg at 10/03/16 1015   Or  . acetaminophen (TYLENOL) suppository 650 mg  650 mg Rectal Q6H PRN Standley BrookingGoodrich, Daniel P, MD      . folic acid (FOLVITE) tablet 1 mg  1 mg Oral Daily Standley BrookingGoodrich, Daniel P, MD   1 mg at 10/05/16 0831  . haloperidol (HALDOL) tablet 2 mg  2 mg Oral Q6H PRN Dhungel, Nishant, MD   2 mg at 10/04/16 2003  . haloperidol lactate (HALDOL) injection 2 mg  2 mg Intramuscular Q6H PRN Dhungel, Nishant, MD   2 mg at 10/04/16 1547  . ibuprofen (ADVIL,MOTRIN) tablet 400 mg  400 mg Oral Q6H PRN Dhungel, Nishant, MD   400 mg at 10/04/16 1721  . LORazepam (ATIVAN) injection 2 mg  2 mg Intravenous Q6H PRN Dhungel, Nishant, MD   2 mg at 10/05/16 0942  . multivitamin with minerals tablet 1 tablet  1 tablet Oral Daily Standley Brooking, MD   1 tablet at 10/05/16 0831  . nicotine (NICODERM CQ - dosed in mg/24 hours) patch 21 mg  21 mg Transdermal Daily Dhungel, Nishant, MD   21 mg at 10/05/16 0832  . ondansetron (ZOFRAN) tablet 4 mg  4 mg Oral Q6H PRN Standley Brooking, MD   4 mg at 10/03/16 1454   Or  . ondansetron (ZOFRAN) injection 4 mg  4 mg Intravenous Q6H PRN Standley Brooking, MD      . QUEtiapine (SEROQUEL) tablet 50 mg  50 mg Oral QHS Dhungel, Nishant, MD   50 mg at 10/04/16 2142  . thiamine (VITAMIN B-1)  tablet 100 mg  100 mg Oral Daily Standley Brooking, MD   100 mg at 10/05/16 1610     Discharge Medications: Please see discharge summary for a list of discharge medications.  Relevant Imaging Results:  Relevant Lab Results:   Additional Information SS#:[id number], LCSW

## 2016-10-05 NOTE — Progress Notes (Signed)
Physical Therapy Treatment Patient Details Name: Phillip Mcdowell MRN: [id number] DOB: 04/10/1980 Today's Date: 10/05/2016    History of Present Illness Pt is 36 y.o. male admitted to ED with polysubstance abuse (alcohol, injected heroin with fentanyl) on 09/26/16 with inability to walk, low back pain, left leg pain and numbness. Pelvic CT shows muscle edema worrisome for infectious myositis; rhabdomyolysis possible. MRI 8/6 shows bilat hippocampi infarc assoc with durg overdose, and additional satellite lacunar infarcts. 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 Pertinent PMH includes bipolar, depression.   PT Comments    Pt progressing with therapy and motivated to participate today. Trialled a dynamic AFO to aid R foot drop with amb with RW and minA for balance; increased difficulty lifting RLE secondary to increased pain, requiring min-modA to place R foot in safe position to ensure wide BOS and step-through pattern. Discussed need for dynamic AFO with MD, who said he will place an order for this. Pt continues to be impulsive with movement; this in addition to impaired gait pattern put him at a significant risk for falls. D/c recs updated to SNF for continued rehab prior to returning home. Will continue to follow acutely.   Follow Up Recommendations  SNF;Supervision/Assistance - 24 hour     Equipment Recommendations  Rolling walker with 5" wheels    Recommendations for Other Services       Precautions / Restrictions Precautions Precautions: Fall Restrictions Weight Bearing Restrictions: No    Mobility  Bed Mobility Overal bed mobility: Needs Assistance Bed Mobility: Supine to Sit     Supine to sit: Min guard Sit to supine: Min guard   General bed mobility comments: Min guard for safety with transfers and cues to slow impulsiveness.  Transfers Overall transfer level: Needs assistance Equipment used: Rolling  walker (2 wheeled) Transfers: Sit to/from Stand Sit to Stand: Min guard         General transfer comment: Stood x3 from bed and chair with RW and min guard for safety. Continues to require repeated cues for technique with hand placement on rolling walker and safety.  Ambulation/Gait Ambulation/Gait assistance: Min assist Ambulation Distance (Feet): 75 Feet Assistive device: Rolling walker (2 wheeled) Gait Pattern/deviations: Step-to pattern;Step-through pattern;Decreased step length - left;Decreased weight shift to right;Decreased dorsiflexion - right;Trendelenburg;Steppage;Narrow base of support Gait velocity: Decreased Gait velocity interpretation: <1.8 ft/sec, indicative of risk for recurrent falls General Gait Details: Amb x3 bouts with RW and minA for balance; used dynamic AFO to assist R footdrop which pt agreed seems to help. Pt reports increased pain in RLE and was having increased difficulty lifting leg for amb today. Required occasional modA to help position R foot.    Stairs            Wheelchair Mobility    Modified Rankin (Stroke Patients Only) Modified Rankin (Stroke Patients Only) Pre-Morbid Rankin Score: No symptoms Modified Rankin: Moderately severe disability     Balance Overall balance assessment: Needs assistance Sitting-balance support: No upper extremity supported;Feet supported Sitting balance-Leahy Scale: Fair     Standing balance support: During functional activity;No upper extremity supported Standing balance-Leahy Scale: Poor Standing balance comment: Increased sway with no UE support; reliant on BUE support for amb                            Cognition Arousal/Alertness: Awake/alert Behavior During Therapy: WFL for tasks assessed/performed Overall Cognitive Status: Impaired/Different from  baseline Area of Impairment: Attention;Following commands;Problem solving;Safety/judgement;Awareness                   Current  Attention Level: Sustained   Following Commands: Follows multi-step commands inconsistently Safety/Judgement: Decreased awareness of safety;Decreased awareness of deficits Awareness: Emergent Problem Solving: Requires verbal cues;Requires tactile cues General Comments: Pt with decreased agitation this session, but continues to require multiple cues for safety throughout treatment. Mentioned he fell back on the bed yesterday after "trying to do too much myself, even though I know I should be asking for help". Reports agreement with need for further rehab before returning home      Exercises      General Comments General comments (skin integrity, edema, etc.): Aunt present throughout session      Pertinent Vitals/Pain Pain Assessment: 0-10 Pain Score: 6  Faces Pain Scale: Hurts even more Pain Location: R leg Pain Descriptors / Indicators: Aching;Sore Pain Intervention(s): Monitored during session;Limited activity within patient's tolerance    Home Living                      Prior Function            PT Goals (current goals can now be found in the care plan section) Acute Rehab PT Goals Patient Stated Goal: Return home PT Goal Formulation: With patient Time For Goal Achievement: 10/15/16 Potential to Achieve Goals: Good Progress towards PT goals: Progressing toward goals    Frequency    Min 4X/week      PT Plan Discharge plan needs to be updated    Co-evaluation PT/OT/SLP Co-Evaluation/Treatment: Yes Reason for Co-Treatment: Necessary to address cognition/behavior during functional activity;For patient/therapist safety PT goals addressed during session: Mobility/safety with mobility;Balance;Proper use of DME        AM-PAC PT "6 Clicks" Daily Activity  Outcome Measure  Difficulty turning over in bed (including adjusting bedclothes, sheets and blankets)?: None Difficulty moving from lying on back to sitting on the side of the bed? : A  Little Difficulty sitting down on and standing up from a chair with arms (e.g., wheelchair, bedside commode, etc,.)?: A Little Help needed moving to and from a bed to chair (including a wheelchair)?: A Little Help needed walking in hospital room?: A Little Help needed climbing 3-5 steps with a railing? : A Little 6 Click Score: 19    End of Session Equipment Utilized During Treatment: Gait belt Activity Tolerance: Patient tolerated treatment well Patient left: in chair;with call bell/phone within reach;with chair alarm set Nurse Communication: Mobility status PT Visit Diagnosis: Unsteadiness on feet (R26.81);Muscle weakness (generalized) (M62.81);Other symptoms and signs involving the nervous system (Z61.096)     Time: 0454-0981 PT Time Calculation (min) (ACUTE ONLY): 39 min  Charges:  $Gait Training: 23-37 mins                    G Codes:       Ina Homes, PT, DPT [phone] Acute Rehab  Malachy Chamber 10/05/2016, 12:17 PM

## 2016-10-05 NOTE — Consult Note (Signed)
[place] Psychiatry Consult   Reason for Consult:  Capacity evaluation Referring Physician: Dr. Clementeen Graham Patient Identification: Phillip Mcdowell MRN:  [id number] Principal Diagnosis: Bilateral leg pain Diagnosis:   Patient Active Problem List   Diagnosis Date Noted  . Hip pain, acute, left [M25.552]   . Leukocytosis [D72.829]   . Bipolar affective disorder ([place]) [F31.9]   . Schizophrenia ([place]) [F20.9]   . Hypokalemia [E87.6]   . Lacunar stroke ([place]) [I63.9]   . 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 Abnormal EKG [R94.31]   . Abnormal head CT [R93.0]   . Acute encephalopathy [G93.40]   . Bilateral leg pain [M79.604, M79.605] 09/26/2016  . Back pain [M54.9] 09/26/2016  . Leg numbness [R20.0] 09/26/2016  . Effusion of hip joint, left [M25.452] 09/26/2016  . Myositis [M60.9] 09/26/2016  . Abnormal finding on MRI of brain [R90.89] 09/26/2016  . Pancreatitis [K85.90] 09/26/2016  . Dehydration [E86.0] 09/26/2016  . Hyponatremia [E87.1] 09/26/2016  . Elevated transaminase level [R74.0] 09/26/2016  . Elevated troponin [R74.8] 09/26/2016  . Polysubstance abuse [F19.10] 09/26/2016    Total Time spent with patient: 1 hour  Subjective:   Phillip Mcdowell is a 36 y.o. male patient admitted with stroke symptoms.  HPI:  Phillip Mcdowell is a 36 year old male with history of polysubstance abuse admitted to [facility] medical center with multiple symptoms of stroke. Patient is seen, chart reviewed and case discussed with patient aunt who is at bed side and Dr. Clementeen Graham. Patient is oriented to his first and last name and being in hospital. Patient has poor recall memory, concentration and language functions. Patient aunt stated that within few minutes he forgets that he was seen and evaluated by his providers. Patient UDS is positive for polysubstance intoxication with amphetamines, benzo's, opiates, and alcohol. Patient and his aunt stated that he has done every drug that is available  on the streets and he was diagnosed with bipolar disorder but non compliant with medicatiton management.Patient has difficult to voice his current medical problems and needed treatments.   MRI of brain: Symmetric hippocampi infarct associated with drug overdose, specifically fentanyl.  Past Psychiatric History: schizoaffective disorder and polysubstance abuse and non compliant with out patient medication management and has no in patient psych or rehab treatments.   Risk to Self: Is patient at risk for suicide?: No Risk to Others:   Prior Inpatient Therapy:   Prior Outpatient Therapy:    Past Medical History:  Past Medical History:  Diagnosis Date  . Bipolar 1 disorder ([place])   . Depression   . Family history of adverse reaction to anesthesia    "maternal grandmother "gets sick"  . History of gout   . Panic attack     Past Surgical History:  Procedure Laterality Date  . INGUINAL HERNIA REPAIR Bilateral 1984  . LAPAROSCOPIC CHOLECYSTECTOMY  ~ 2014   Family History:  Family History  Problem Relation Age of Onset  . Brain cancer Paternal Grandfather    Family Psychiatric  History: Unknown Social History:  History  Alcohol Use  . 12.6 oz/week  . 21 Cans of beer per week    Comment: 09/28/2016 "2-3 24oz cans of beer mixed w/tea/day"     History  Drug Use  . Types: Marijuana    Comment: "pills", also snorts drugs, h/o IVDU    Social History   Social History  . Marital status: Legally Separated    Spouse name: N/A  . Number of children: N/A  . Years of  education: N/A   Social History Main Topics  . Smoking status: Current Every Day Smoker    Packs/day: 1.00    Years: 19.00    Types: Cigarettes  . Smokeless tobacco: Never Used  . Alcohol use 12.6 oz/week    21 Cans of beer per week     Comment: 09/28/2016 "2-3 24oz cans of beer mixed w/tea/day"  . Drug use: Yes    Types: Marijuana     Comment: "pills", also snorts drugs, h/o IVDU  . Sexual activity: Not Asked    Other Topics Concern  . None   Social History Narrative  . None   Additional Social History:    Allergies:   Allergies  Allergen Reactions  . Penicillins Swelling    Has patient had a PCN reaction causing immediate rash, facial/tongue/throat swelling, SOB or lightheadedness with hypotension: Yes Has patient had a PCN reaction causing severe rash involving mucus membranes or skin necrosis: No Has patient had a PCN reaction that required hospitalization: No Has patient had a PCN reaction occurring within the last 10 years: Yes If all of the above answers are "NO", then may proceed with Cephalosporin use.   . Sulfa Antibiotics Other (See Comments)    CHILDHOOD REACTION    Labs:  Results for orders placed or performed during the hospital encounter of 09/26/16 (from the past 48 hour(s))  Hepatic function panel     Status: Abnormal   Collection Time: 10/04/16  2:48 AM  Result Value Ref Range   Total Protein 5.4 (L) 6.5 - 8.1 g/dL   Albumin 3.1 (L) 3.5 - 5.0 g/dL   AST 66 (H) 15 - 41 U/L   ALT 76 (H) 17 - 63 U/L   Alkaline Phosphatase 47 38 - 126 U/L   Total Bilirubin 1.0 0.3 - 1.2 mg/dL   Bilirubin, Direct 0.1 0.1 - 0.5 mg/dL   Indirect Bilirubin 0.9 0.3 - 0.9 mg/dL  Basic metabolic panel     Status: Abnormal   Collection Time: 10/04/16  2:48 AM  Result Value Ref Range   Sodium 139 135 - 145 mmol/L   Potassium 3.7 3.5 - 5.1 mmol/L   Chloride 106 101 - 111 mmol/L   CO2 25 22 - 32 mmol/L   Glucose, Bld 87 65 - 99 mg/dL   BUN 13 6 - 20 mg/dL   Creatinine, Ser 0.79 0.61 - 1.24 mg/dL   Calcium 8.8 (L) 8.9 - 10.3 mg/dL   GFR calc non Af Amer >60 >60 mL/min   GFR calc Af Amer >60 >60 mL/min    Comment: (NOTE) The eGFR has been calculated using the CKD EPI equation. This calculation has not been validated in all clinical situations. eGFR's persistently <60 mL/min signify possible Chronic Kidney Disease.    Anion gap 8 5 - 15    Current Facility-Administered Medications   Medication Dose Route Frequency Provider Last Rate Last Dose  . acetaminophen (TYLENOL) tablet 650 mg  650 mg Oral Q6H PRN Samuella Cota, MD   650 mg at 10/03/16 1015   Or  . acetaminophen (TYLENOL) suppository 650 mg  650 mg Rectal Q6H PRN Samuella Cota, MD      . folic acid (FOLVITE) tablet 1 mg  1 mg Oral Daily Samuella Cota, MD   1 mg at 10/05/16 0831  . haloperidol (HALDOL) tablet 2 mg  2 mg Oral Q6H PRN Dhungel, Nishant, MD   2 mg at 10/04/16 2003  . haloperidol lactate (  HALDOL) injection 2 mg  2 mg Intramuscular Q6H PRN Dhungel, Nishant, MD   2 mg at 10/04/16 1547  . ibuprofen (ADVIL,MOTRIN) tablet 400 mg  400 mg Oral Q6H PRN Dhungel, Nishant, MD   400 mg at 10/05/16 1044  . LORazepam (ATIVAN) injection 2 mg  2 mg Intravenous Q6H PRN Dhungel, Nishant, MD   2 mg at 10/05/16 0942  . multivitamin with minerals tablet 1 tablet  1 tablet Oral Daily Samuella Cota, MD   1 tablet at 10/05/16 0831  . nicotine (NICODERM CQ - dosed in mg/24 hours) patch 21 mg  21 mg Transdermal Daily Dhungel, Nishant, MD   21 mg at 10/05/16 0832  . ondansetron (ZOFRAN) tablet 4 mg  4 mg Oral Q6H PRN Samuella Cota, MD   4 mg at 10/03/16 1454   Or  . ondansetron (ZOFRAN) injection 4 mg  4 mg Intravenous Q6H PRN Samuella Cota, MD      . QUEtiapine (SEROQUEL) tablet 50 mg  50 mg Oral QHS Dhungel, Nishant, MD   50 mg at 10/04/16 2142  . thiamine (VITAMIN B-1) tablet 100 mg  100 mg Oral Daily Samuella Cota, MD   100 mg at 10/05/16 0831    Musculoskeletal: Strength & Muscle Tone: decreased Gait & Station: unable to stand Patient leans: N/A  Psychiatric Specialty Exam: Physical Exam  ROS  Blood pressure 127/80, pulse 82, temperature 98.7 F (37.1 C), temperature source Axillary, resp. rate 16, height 6' 1"  (1.854 m), weight 81.6 kg (180 lb), SpO2 98 %.Body mass index is 23.75 kg/m.  General Appearance: Guarded  Eye Contact:  Fair  Speech:  Blocked and Slow  Volume:  Decreased   Mood:  Depressed  Affect:  Depressed and Flat  Thought Process:  Coherent  Orientation:  Full (Time, Place, and Person)  Thought Content:  Logical  Suicidal Thoughts:  No  Homicidal Thoughts:  No  Memory:  Immediate;   Fair Recent;   Poor Remote;   Fair  Judgement:  Poor  Insight:  Fair  Psychomotor Activity:  Decreased  Concentration:  Concentration: Fair and Attention Span: Poor  Recall:  Poor  Fund of Knowledge:  Poor  Language:  Fair  Akathisia:  Negative  Handed:  Right  AIMS (if indicated):     Assets:  Others:  TBD  ADL's:  Impaired  Cognition:  Impaired,  Moderate  Sleep:        Treatment Plan Summary: 36 years old single male with polysubstance use disorder presented with stroke and non compliant with medication management.  Patient does not meet criteria for capacity to make his own medical decisions and placement needs.  Refer to Unit CSW to assist his family regarding MCPOA if needed. Medication recommendation: Increase Seroquel 50 mg TID for agitation and mood swings  Does not appear to be withdrawing from alcohol or benzo's and opiates at this time  Disposition: Benefit from SNF with neurorehabilitation No evidence of imminent risk to self or others at present.   Supportive therapy provided about ongoing stressors.  Ambrose Finland, MD 10/05/2016 12:18 PM

## 2016-10-05 NOTE — Social Work (Addendum)
CSW discussed with Clinical Supervisor need for LOG. Pt has polysubstance use and stroke this admission. Pt unable to walk due to impairment. Pt walked 50 feet with assistance and walker and will need continued rehab. Mom and aunt are unable to care for home as aunt on oxygen. Pt is uninsured and will not qualify for at home services given lack of medical insurance. Financial counselors have been involved as well.  LOG has not been approved at this time. CSW will continue to follow.    PASSR pending. CSW will need 30 day note from doctor for rehab. CSW will f/u on same.  Keene BreathPatricia Jheremy Boger, LCSW Clinical Social Worker [phone]

## 2016-10-06 MED ORDER — QUETIAPINE FUMARATE 50 MG PO TABS: 50.0000 mg | ORAL_TABLET | Freq: Three times a day (TID) | ORAL | 0 refills | Status: DC

## 2016-10-06 NOTE — Progress Notes (Signed)
Physical Therapy Treatment Patient Details Name: Phillip Mcdowell MRN: [id number] DOB: Sep 22, 1980 Today's Date: 10/06/2016    History of Present Illness Pt is 36 y.o. male admitted to ED with polysubstance abuse (alcohol, injected heroin with fentanyl) on 09/26/16 with inability to walk, low back pain, left leg pain and numbness. Pelvic CT shows muscle edema worrisome for infectious myositis; rhabdomyolysis possible. MRI 8/6 shows bilat hippocampi infarc assoc with durg overdose, and additional satellite lacunar infarcts. 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 Pertinent PMH includes bipolar, depression.   PT Comments    Pt refusing out of chair mobility today. Per discussion with SW, pt will not be able to be placed with SNF at d/c; but there is a [facility] for [facility] services. Pt states "I want to get home to get back to work." Significant time spent educ pt on current condition, increased fall risk, and the fact that he is unsafe to return to work and will be unsafe returning home without continued rehab. Pt continues to get up in room without assist despite max educ and encouragement to adhere to safety policies and call for help. Pt states he is going to do what he "needs to do" no matter what is discussed. Will continue to follow acutely.    Follow Up Recommendations  SNF;Supervision/Assistance - 24 hour     Equipment Recommendations  Rolling walker with 5" wheels    Recommendations for Other Services       Precautions / Restrictions Precautions Precautions: Fall Restrictions Weight Bearing Restrictions: No    Mobility  Bed Mobility               General bed mobility comments: Received sitting in bedside chair. Min guard to reposition in chair  Transfers                 General transfer comment: Pt refused for out of chair mobility despite max encouragement and educ  Ambulation/Gait                  Stairs            Wheelchair Mobility    Modified Rankin (Stroke Patients Only)       Balance Overall balance assessment: Needs assistance Sitting-balance support: No upper extremity supported;Feet supported Sitting balance-Leahy Scale: Fair                                      Cognition Arousal/Alertness: Awake/alert Behavior During Therapy: Agitated Overall Cognitive Status: Impaired/Different from baseline Area of Impairment: Orientation;Attention;Memory;Following commands;Safety/judgement;Awareness;Problem solving                 Orientation Level: Disoriented to;Place;Time;Situation Current Attention Level: Sustained Memory: Decreased short-term memory Following Commands: Follows multi-step commands inconsistently Safety/Judgement: Decreased awareness of safety;Decreased awareness of deficits Awareness: Emergent Problem Solving: Requires verbal cues;Requires tactile cues General Comments: Pt states "I'm going home, I have to get back to work; I've got to go next door to get it..."; reoriented pt to situation, place, and time. 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 Pt states he is going home, then later will state "I never said I was going home". States he will not wear an AFO if provided and  will not get up with PT today.      Exercises      General Comments        Pertinent Vitals/Pain Pain Assessment: Faces Faces Pain Scale: Hurts even more Pain Location: R leg Pain Descriptors / Indicators: Aching;Sore;Constant Pain Intervention(s): Monitored during session    Home Living                      Prior Function            PT Goals (current goals can now be found in the care plan section) Acute Rehab PT Goals Patient Stated Goal: Return home PT Goal Formulation: With patient Time  For Goal Achievement: 10/15/16 Potential to Achieve Goals: Good Progress towards PT goals: Not progressing toward goals - comment (Unwilling to participate with mobility today)    Frequency    Min 4X/week      PT Plan Current plan remains appropriate    Co-evaluation              AM-PAC PT "6 Clicks" Daily Activity  Outcome Measure  Difficulty turning over in bed (including adjusting bedclothes, sheets and blankets)?: None Difficulty moving from lying on back to sitting on the side of the bed? : A Little Difficulty sitting down on and standing up from a chair with arms (e.g., wheelchair, bedside commode, etc,.)?: A Little Help needed moving to and from a bed to chair (including a wheelchair)?: A Little Help needed walking in hospital room?: A Lot Help needed climbing 3-5 steps with a railing? : A Lot 6 Click Score: 17    End of Session Equipment Utilized During Treatment: Gait belt Activity Tolerance: Treatment limited secondary to agitation Patient left: in chair;with call bell/phone within reach;with chair alarm set;with family/visitor present Nurse Communication: Mobility status PT Visit Diagnosis: Unsteadiness on feet (R26.81);Muscle weakness (generalized) (M62.81);Other symptoms and signs involving the nervous system (R29.898)     Time: [id number] PT Time Calculation (min) (ACUTE ONLY): 20 min  Charges:  $Self Care/Home Management: 8-22                    G Codes:      Phillip Mcdowell, PT, DPT [phone] Acute Rehab  Phillip Mcdowell 10/06/2016, 3:18 PM

## 2016-10-06 NOTE — Care Management Note (Signed)
Case Management Note  Patient Details  Name: Phillip Mcdowell MRN: [id number] Date of Birth: 05/01/1980  Subjective/Objective:       Patient admitted with weakness, confusion, inability to walk.               Action/Plan:  Case manager has spoken with patient and family , he is refusing SNF. Will go home with his mother, Phillip Mcdowell. Case manager contacted karen Nusbaumm, [facility] Liaison with referral. Patient will be assessed for charity services. CM will have to arrange for patient to have a PCP before [facility] will accept him,. CM [place] and will contact patient and [facility].    Expected Discharge Date:  10/06/16               Expected Discharge Plan:  Home w Home Health Services  In-House Referral:  NA  Discharge planning Services  CM Consult  Post Acute Care Choice:  Durable Medical Equipment, Home Health Choice offered to:   Phillip Mcdowell(Aunt)  DME Arranged:  Phillip HumphreysWalker rolling DME Agency:  [facility]  [facility]  [facility]  Status of Service:  In process, will continue to follow  If discussed at Long Length of Stay Meetings, dates discussed:    Additional Comments:  Phillip Mcdowell, Phillip Dearmas Naomi, RN 10/06/2016, 4:12 PM

## 2016-10-06 NOTE — Social Work (Addendum)
CSW following up with [facility] for SNF placement.   [facility] has denied patient for placement.  10:41 am CSW received call back from [facility] and they have declined patient as well as Information systems manager.  12:30 pm CSW discussed with aunt SNF placement for rehab. CSW advised that we are trying to get placement and location is pending. CSW confirmed that patient resides with aunt and he can return home once DC from SNF.  CSW met with admissions staff from [facility] to discuss transition to SNF. CSW advsied that clinical supervisor approved a 14 day LOG.  1:00pm- SNF declined to take patient after patient became upset and yelling at aunt. CSW called medical director Dr. Reynaldo Minium. CSW will continue to follow.  CSW reviewed psych notes and it indicated that patient did not have capacity to make medical decisions. CSW f/u with dr. Wendee Beavers to make referral to spiritual care to assist.  Elissa Hefty, [place] Social Worker [phone]

## 2016-10-06 NOTE — Progress Notes (Signed)
Pt discharge education and instructions completed with pt and family at bedside. All voices understanding and denies any questions. Pt aunt handed pt Seroquel prescription. Pt and aunt pt was out of xanax and needed prescription for it. Dr Cena BentonVega notified and he said "I don't do xanax". MD said pt to contact his PCP for refill or can take over the counter benadryl. Pt and family informed and they voices understanding. Pt IV removed; pt home DME walker delivered to pt at bedside. Pt transported off unit via wheelchair with belongings and family to the side. Pt discharge home with aunt to transport him home. Dionne BucyP. Amo Dalessandro Baldyga RN

## 2016-10-06 NOTE — Progress Notes (Signed)
Responded to consult to create POA. Pt's nurse and social worker were in rm, the former talking w/ pt, so excused self to ask if pt were interested in doing this, saying I could leave information and could return later. Pt did not seem very verbal then as he was sitting up in chair near door while family member was sitting in chair near window (and did not interact w/ either of us while I was in rm). Pt  finally said he did not think he wanted to do it, but I could leave the information. I did.   Social worker followed me out, to let me know that she did not think he could do it then b/c a psych note had said he may not be competent to do it. I did not find him particularly competent to do it in our brief encounter -- and he specifically expressed the desire then not to do it, only to receive information.  Prior chaplain in ED had explained to family the limits of what [facility] can/cannot do for pts desiring advanced directives. Chaplain available for f/u if needed.    10/06/16 1500  Clinical Encounter Type  Visited With Patient;Health care provider  Visit Type Follow-up;Psychological support;Spiritual support;Social support  Referral From Nurse  Spiritual Encounters  Spiritual Needs Emotional   Ephraim Hamburgerynthia A Shaquayla Klimas, [address]

## 2016-10-06 NOTE — Discharge Summary (Signed)
Physician Discharge Summary  Phillip Mcdowell ZOX:[id number] DOB: Apr 10, 1980 DOA: 09/26/2016  PCP: Patient, No Pcp Per  Admit date: 09/26/2016 Discharge date: 10/06/2016  Time spent: > 35 minutes  Recommendations for Outpatient Follow-up:  1. Ensure f/u with Neurologist and psychiatrist   Discharge Diagnoses:  Principal Problem:   Bilateral leg pain Active Problems:   Back pain   Leg numbness   Effusion of hip joint, left   Myositis   Abnormal finding on MRI of brain   Pancreatitis   Dehydration   Hyponatremia   Elevated transaminase level   Elevated troponin   Polysubstance abuse   Abnormal head CT   Acute encephalopathy   Abnormal EKG   Hip pain, acute, left   Leukocytosis   Bipolar affective disorder (HCC)   Schizophrenia (HCC)   Hypokalemia   Lacunar stroke (HCC)   Foraminal stenosis of cervical region   Discharge Condition: stable  Diet recommendation: regular diet.  Filed Weights   09/26/16 0701  Weight: 81.6 kg (180 lb)    History of present illness:  36 year old male with polysubstance abuse (alcohol, injected heroin with fentanyl) presented to the ED with inability to walk, low back pain, left leg pain and numbness. As per his mother patient has had frequent falls in the past 2 weeks. In the ED workup showed multiple abnormalities including leukocytosis, elevated LFTs, rhabdomyolysis. CT of the head showed abnormal appearance of the temporal horns of the lateral ventricles uncertain for meningitis versus drug use. Patient was transferred to [facility] for further evaluation.  Hospital Course:   Principal problem Low back pain with bilateral leg pains, weakness and numbness Pain likely secondary to rhabdomyolysis , much improved now. 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 Also shows satellite lacunar infarcts. Hyperreflexia on exam is likely due to basal ganglia changes. MRI of the  cervical spine without abnormal cord or bone marrow signal. Shows C5-C6 foraminal disc protrusion with mild to moderate foraminal stenosis.  PT recommended she had reevaluated and recommend SNF but family would like to take patient home. Discussed with Child psychotherapist.  Acute encephalopathy Possibly secondary to hippocampal infarct. Better oriented since admission but still having some waxing and waning confusion and episodes of agitation. Reportedly he does have history of bipolar disorder and not on medications.. 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  EEG with diffuse nonspecific cerebral dysfunction . Neurology consult appreciated.   Repeat head CT on 8/13 (done for jerky movements of his lower extremities) shows no acute findings and interval improvement in hypoattenuation seen in the hippocampi.  Psych consult appreciated (note pending but discussed with Dr. Elsie Saas) . Patient deemed not competent to make complex medical decisions. Psychiatry recommended seroquel  Left hip joint effusion Suspect inflammatory from synovial fluid analysis. Cultures negative and negative for crystals. No pain or tenderness on hip movement. Discontinued empiric antibiotics.  Acute transaminitis secondary to rhabdomyolysis.  Both LFTs and CPK improved.  Right foot drop Ordered dynamic AFO.  Hypokalemia Replenished.   Elevated troponin Secondary to rhabdomyolysis. No chest pain or significant EKG changes.  HCV antibody positive Needs outpatient follow-up.  Possible pancreatitis Elevated lipase with mild retroperitoneal edema and pancreatic thickening. Lipase trended down. No abdominal pain or tenderness reported to me on day of d/c  Dehydration and hyponatremia Resolved with IV fluids.  Polysubstance use Daily alcohol and marijuana use. Urine toxin positive for amphetamines, opiates and benzodiazepines. Prior IV heroin use.  -  no signs of  withdrawal on day of d/c  Procedures:  Please see above  Consultations:  Psychiatry  Discharge Exam: Vitals:   10/05/16 2211 10/06/16 0514  BP: 135/74 132/75  Pulse: 85 89  Resp: 16 17  Temp: 97.9 F (36.6 C) 98.3 F (36.8 C)  SpO2: 99% 99%    General: Pt in nad, alert and awake Cardiovascular: rrr, no rubs Respiratory: no increased wob, no wheezes  Discharge Instructions   Discharge Instructions    Call MD for:  severe uncontrolled pain    Complete by:  As directed    Call MD for:  temperature >100.4    Complete by:  As directed    Diet - low sodium heart healthy    Complete by:  As directed    Discharge instructions    Complete by:  As directed    Please ensure patient continues PT and OT   Increase activity slowly    Complete by:  As directed      Current Discharge Medication List    START taking these medications   Details  QUEtiapine (SEROQUEL) 50 MG tablet Take 1 tablet (50 mg total) by mouth 3 (three) times daily. Qty: 90 tablet, Refills: 0      CONTINUE these medications which have NOT CHANGED   Details  ALPRAZolam (XANAX) 0.5 MG tablet Take 0.5-1 mg by mouth 4 (four) times daily as needed for anxiety.       Allergies  Allergen Reactions  . Penicillins Swelling    Has patient had a PCN reaction causing immediate rash, facial/tongue/throat swelling, SOB or lightheadedness with hypotension: Yes Has patient had a PCN reaction causing severe rash involving mucus membranes or skin necrosis: No Has patient had a PCN reaction that required hospitalization: No Has patient had a PCN reaction occurring within the last 10 years: Yes If all of the above answers are "NO", then may proceed with Cephalosporin use.   . Sulfa Antibiotics Other (See Comments)    CHILDHOOD REACTION   Follow-up Information    [facility] Follow up.   Why:  [facility] will contact you to arrange start date and time for your services Contact  information: [address] [place] [address] [phone]            The results of significant diagnostics from this hospitalization (including imaging, microbiology, ancillary and laboratory) are listed below for reference.    Significant Diagnostic Studies: Ct Head Wo Contrast  Result Date: 10/04/2016 CLINICAL DATA:  Altered mental status EXAM: CT HEAD WITHOUT CONTRAST TECHNIQUE: Contiguous axial images were obtained from the base of the skull through the vertex without intravenous contrast. Sagittal and coronal MPR images reconstructed from axial data set. COMPARISON:  09/27/2016 FINDINGS: Brain: Mild generalized atrophy for age. Normal ventricular morphology. No midline shift or mass effect. Otherwise normal appearance of brain parenchyma. Hypoattenuation within the hippocampi on the previous exam improved. No intracranial hemorrhage, mass lesion or evidence of acute infarction. Vascular: Unremarkable Skull: Normal appearance Sinuses/Orbits: Clear Other: N/A IMPRESSION: No acute intracranial abnormalities. Interval improvement of the hypoattenuation seen previously within the hippocampi. Electronically Signed   By: Ulyses Southward M.D.   On: 10/04/2016 12:25   Ct Head Wo Contrast  Result Date: 09/27/2016 CLINICAL DATA:  36 y/o  M; substance abuse and abnormal MRI. EXAM: CT HEAD WITHOUT CONTRAST TECHNIQUE: Contiguous axial images were obtained from the base of the skull through the vertex without intravenous contrast. COMPARISON:  09/27/2016 MRI head. FINDINGS: Brain: Hypoattenuation and mild mass effect of the hippocampi by corresponding to reduced diffusion on prior MRI. Punctate foci of reduced diffusion on MRI are not visible on CT. No evidence for new acute intracranial hemorrhage, infarct, focal mass effect, hydrocephalus, or extra-axial collection. Vascular: No hyperdense vessel or unexpected calcification. Skull: Normal. Negative for fracture or focal lesion. Sinuses/Orbits: No  acute finding. Other: None. IMPRESSION: 1. Hypoattenuation and mild mass effect of the hippocampi corresponding to diffusion abnormality on MRI. 2. No evidence for new acute intracranial hemorrhage, acute infarct, or focal mass effect. Electronically Signed   By: Mitzi Hansen M.D.   On: 09/27/2016 21:40   Ct Head Wo Contrast  Result Date: 09/26/2016 CLINICAL DATA:  Numbness in legs.  Found down. EXAM: CT HEAD WITHOUT CONTRAST CT CERVICAL SPINE WITHOUT CONTRAST TECHNIQUE: Multidetector CT imaging of the head and cervical spine was performed following the standard protocol without intravenous contrast. Multiplanar CT image reconstructions of the cervical spine were also generated. COMPARISON:  None. FINDINGS: CT HEAD FINDINGS Brain: Ventricles are normal in size and configuration. However, temporal horns of the lateral ventricles have an abnormal appearance bilaterally, perhaps indicating adjacent hippocampal edema and/or debris related to underlying meningitis. Remainder of the brain demonstrate normal gray-white matter attenuation. No parenchymal or extra-axial hemorrhage. Vascular: No hyperdense vessel or unexpected calcification. Skull: Normal. Negative for fracture or focal lesion. Sinuses/Orbits: No acute finding. Other: None. CT CERVICAL SPINE FINDINGS Alignment: Dextroscoliosis. No evidence of acute vertebral body subluxation. Skull base and vertebrae: No fracture line or displaced fracture fragment identified. Facet joints appear intact and normal in alignment. Soft tissues and spinal canal: No prevertebral fluid or swelling. No visible canal hematoma. Disc levels: Disc spaces are well preserved throughout. No significant central canal stenosis at any level. Upper chest: Mild emphysematous change at the lung apices. No acute findings. Other: None. IMPRESSION: 1. Temporal horns of the lateral ventricles have an abnormal appearance bilaterally, perhaps indicating adjacent hippocampal edema and/or  debris related to underlying meningitis. This appearance can also be related to drug use. Recommend brain MRI with contrast for further characterization. 2. No fracture or acute subluxation within the cervical spine. Scoliosis. 3. Emphysema. These results were called by telephone at the time of interpretation on 09/26/2016 at 11:11 am to Dr. Margarita Grizzle , who verbally acknowledged these results. Electronically Signed   By: Bary Richard M.D.   On: 09/26/2016 11:12   Ct Cervical Spine Wo Contrast  Result Date: 09/26/2016 CLINICAL DATA:  Numbness in legs.  Found down. EXAM: CT HEAD WITHOUT CONTRAST CT CERVICAL SPINE WITHOUT CONTRAST TECHNIQUE: Multidetector CT imaging of the head and cervical spine was performed following the standard protocol without intravenous contrast. Multiplanar CT image reconstructions of the cervical spine were also generated. COMPARISON:  None. FINDINGS: CT HEAD FINDINGS Brain: Ventricles are normal in size and configuration. However, temporal horns of the lateral ventricles have an abnormal appearance bilaterally, perhaps indicating adjacent hippocampal edema and/or debris related to underlying meningitis. Remainder of the brain demonstrate normal gray-white matter attenuation. No parenchymal or extra-axial hemorrhage. Vascular: No hyperdense vessel or unexpected calcification. Skull: Normal. Negative for fracture or focal lesion. Sinuses/Orbits: No acute finding. Other: None. CT CERVICAL SPINE FINDINGS Alignment: Dextroscoliosis. No evidence of acute vertebral body subluxation. Skull base and vertebrae: No fracture line or displaced fracture fragment identified. Facet joints appear intact and normal in alignment. Soft tissues and spinal canal: No prevertebral fluid or swelling. No visible canal hematoma. Disc levels:  Disc spaces are well preserved throughout. No significant central canal stenosis at any level. Upper chest: Mild emphysematous change at the lung apices. No acute findings.  Other: None. IMPRESSION: 1. Temporal horns of the lateral ventricles have an abnormal appearance bilaterally, perhaps indicating adjacent hippocampal edema and/or debris related to underlying meningitis. This appearance can also be related to drug use. Recommend brain MRI with contrast for further characterization. 2. No fracture or acute subluxation within the cervical spine. Scoliosis. 3. Emphysema. These results were called by telephone at the time of interpretation on 09/26/2016 at 11:11 am to Dr. Margarita Grizzle , who verbally acknowledged these results. Electronically Signed   By: Bary Richard M.D.   On: 09/26/2016 11:12   Ct Thoracic Spine Wo Contrast  Result Date: 09/26/2016 CLINICAL DATA:  Suspected fall yesterday.  Limited history. EXAM: CT THORACIC AND LUMBAR SPINE WITHOUT CONTRAST TECHNIQUE: Multidetector CT imaging of the thoracic and lumbar spine was performed without contrast. Multiplanar CT image reconstructions were also generated. COMPARISON:  None. FINDINGS: CT THORACIC SPINE FINDINGS Alignment: Mild exaggerated kyphosis.  No traumatic malalignment. Vertebrae: Negative for fracture or bone lesion. Paraspinal and other soft tissues: No acute finding. 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 Disc levels: No evidence of degenerative impingement. Ligamentum flavum thickening in ossification at T4-5 and T5-6. CT LUMBAR SPINE FINDINGS Segmentation: 5 lumbar type vertebrae. Alignment: Normal. Vertebrae: No acute fracture or focal pathologic process. Paraspinal and other soft tissues: Mild retroperitoneal edema centered in the central and left upper quadrant. The pancreatic tail appears somewhat thickened and indistinct diffusely. Disc levels: No significant degenerative changes or evidence of impingement. IMPRESSION: CT THORACIC SPINE IMPRESSION No evidence of injury. CT LUMBAR SPINE IMPRESSION 1. No evidence of lumbar spine injury. 2. Mild retroperitoneal edema and pancreatic tail thickening, please  correlate for pancreatitis by exam and labs. Electronically Signed   By: Marnee Spring M.D.   On: 09/26/2016 10:58   Ct Lumbar Spine Wo Contrast  Result Date: 09/26/2016 CLINICAL DATA:  Suspected fall yesterday.  Limited history. EXAM: CT THORACIC AND LUMBAR SPINE WITHOUT CONTRAST TECHNIQUE: Multidetector CT imaging of the thoracic and lumbar spine was performed without contrast. Multiplanar CT image reconstructions were also generated. COMPARISON:  None. FINDINGS: CT THORACIC SPINE FINDINGS Alignment: Mild exaggerated kyphosis.  No traumatic malalignment. Vertebrae: Negative for fracture or bone lesion. Paraspinal and other soft tissues: No acute finding. 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 Disc levels: No evidence of degenerative impingement. Ligamentum flavum thickening in ossification at T4-5 and T5-6. CT LUMBAR SPINE FINDINGS Segmentation: 5 lumbar type vertebrae. Alignment: Normal. Vertebrae: No acute fracture or focal pathologic process. Paraspinal and other soft tissues: Mild retroperitoneal edema centered in the central and left upper quadrant. The pancreatic tail appears somewhat thickened and indistinct diffusely. Disc levels: No significant degenerative changes or evidence of impingement. IMPRESSION: CT THORACIC SPINE IMPRESSION No evidence of injury. CT LUMBAR SPINE IMPRESSION 1. No evidence of lumbar spine injury. 2. Mild retroperitoneal edema and pancreatic tail thickening, please correlate for pancreatitis by exam and labs. Electronically Signed   By: Marnee Spring M.D.   On: 09/26/2016 10:58   Ct Pelvis Wo Contrast  Result Date: 09/26/2016 CLINICAL DATA:  Bilateral lower extremity numbness. The patient is unable to walk. Possible fall yesterday. Elevated white blood cell count. EXAM: CT PELVIS WITHOUT CONTRAST TECHNIQUE: Multidetector CT imaging of the pelvis was performed following the standard protocol without intravenous contrast. COMPARISON:  None. FINDINGS: Urinary Tract:   Negative. Bowel:  Imaged  bowel loops and the appendix appear normal. Vascular/Lymphatic: No pathologically enlarged lymph nodes. No significant vascular abnormality seen. Reproductive:  Negative. Other:  No intra-abdominal fluid collection. Musculoskeletal: Multifocal muscular edema is identified. Involved muscles include the right gluteus maximus and medius, left gluteus medius and minimus and left obturator externus. Left hip joint effusion is identified. Subcutaneous stranding is seen about the right buttock and left greater trochanter. No bony destructive change or periosteal reaction is identified. No avascular necrosis of the femoral heads. Small synovial herniation pit about both hips are noted. IMPRESSION: Multifocal muscle edema about the pelvis most most worrisome for infectious myositis in this patient with an elevated white blood cell count. Rhabdomyolysis is possible. Left hip joint effusion worrisome for septic joint given elevated white count. No evidence of osteomyelitis. Subcutaneous stranding about the left hip and right buttock likely due to cellulitis. Electronically Signed   By: Drusilla Kannerhomas  Dalessio M.D.   On: 09/26/2016 11:17   Mr Brain Wo Contrast  Addendum Date: 09/27/2016   ADDENDUM REPORT: 09/27/2016 13:59 ADDENDUM: Acute findings discussed with and reconfirmed by Dr.Kirkpatrick, Neurology on 09/27/2016 at 1:45pm. Electronically Signed   By: Awilda Metroourtnay  Bloomer M.D.   On: 09/27/2016 13:59   Result Date: 09/27/2016 CLINICAL DATA:  Weakness and pain, follow-up abnormal temporal horns. Evaluate for meningitis. History of polysubstance abuse. EXAM: MRI HEAD WITHOUT CONTRAST TECHNIQUE: Multiplanar, multiecho pulse sequences of the brain and surrounding structures were obtained without intravenous contrast. COMPARISON:  CT HEAD September 26, 2016 FINDINGS: Multiple sequences are mildly or moderately motion degraded. INTRACRANIAL CONTENTS: Symmetric reduced diffusion bilateral hippocampi with low ADC  values and bright FLAIR T2 hyperintense signal. Subcentimeter foci of reduced diffusion bilateral basal ganglia, LEFT cerebellum. No intraventricular reduced diffusion. No abnormal susceptibility artifact to suggest hemorrhage. No midline shift, mass effect or masses. No abnormal extra-axial fluid collections. VASCULAR: Normal major intracranial vascular flow voids present at skull base. SKULL AND UPPER CERVICAL SPINE: No abnormal sellar expansion. No suspicious calvarial bone marrow signal. Craniocervical junction maintained. SINUSES/ORBITS: The mastoid air-cells and included paranasal sinuses are well-aerated.The included ocular globes and orbital contents are non-suspicious. OTHER: None. IMPRESSION: 1. 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 Findings less likely reflect herpes or limbic encephalitis. Additional satellite lacunar infarcts. 2. No imaging findings of meningitis by noncontrast MRI. Electronically Signed: By: Awilda Metroourtnay  Bloomer M.D. On: 09/27/2016 13:51   Mr Cervical Spine Wo Contrast  Result Date: 09/29/2016 CLINICAL DATA:  36 y/o M; history of polysubstance abuse presenting with inability to walk, lower back pain, left leg pain and numbness. EXAM: MRI CERVICAL SPINE WITHOUT CONTRAST TECHNIQUE: Multiplanar, multisequence MR imaging of the cervical spine was performed. No intravenous contrast was administered. COMPARISON:  09/26/2016 CT cervical spine. FINDINGS: Motion degraded study. Alignment: Straightening of cervical lordosis.  No listhesis. Vertebrae: No fracture, evidence of discitis, or bone lesion. Cord: Normal signal and morphology. Posterior Fossa, vertebral arteries, paraspinal tissues: Increased T2 signal within the hippocampi by a as seen on prior MRI of the brain. Disc levels: C2-3: No significant disc displacement, foraminal narrowing, or canal stenosis. C3-4: No significant disc displacement, foraminal narrowing, or canal stenosis. C4-5: No  significant disc displacement, foraminal narrowing, or canal stenosis. C5-6: Right subarticular to foraminal small disc protrusion with mild-to-moderate right foraminal stenosis. C6-7: No significant disc displacement, foraminal narrowing, or canal stenosis. C7-T1: No significant disc displacement, foraminal narrowing, or canal stenosis. IMPRESSION: 1. No abnormal cord or bone marrow signal. 2. C5-6 right subarticular to foraminal disc  protrusion with mild-to-moderate right foraminal stenosis. 3. No significant canal stenosis. Electronically Signed   By: Mitzi Hansen M.D.   On: 09/29/2016 19:55   Dg Chest Port 1 View  Result Date: 09/26/2016 CLINICAL DATA:  Pain. EXAM: PORTABLE CHEST 1 VIEW COMPARISON:  None. FINDINGS: Normal heart size and mediastinal contours. No acute infiltrate or edema. No effusion or pneumothorax. No acute osseous findings. IMPRESSION: Negative portable chest. Electronically Signed   By: Marnee Spring M.D.   On: 09/26/2016 09:25   Dg Fluoro Guided Needle Plc Aspiration/injection Loc  Result Date: 09/28/2016 CLINICAL DATA:  Bilateral lower extremity numbness. Left hip fluid noted on recent imaging. History of polysubstance abuse. EXAM: LEFT HIP ASPIRATION UNDER FLUOROSCOPY FLUOROSCOPY TIME:  Fluoroscopy Time:  30 seconds Number of Acquired Spot Images: 3 PROCEDURE: Informed consent was obtained from the patient's parents. The patient's mental status was altered and he was unable to give consent. Left common femoral vein was palpated. Left femoral head and neck region was identified with fluoroscopy. Skin was prepped with Betadine. Sterile field was created. Skin was anesthetized with 1% lidocaine. 20 gauge spinal needle was directed onto the lateral aspect of the left femoral head and neck junction with fluoroscopic guidance. 2 mL of yellow joint fluid was aspirated. Needle was removed without complication. Bandage placed over the puncture site. IMPRESSION: Technically  successful left hip aspiration with fluoroscopy. Fluid was sent for analysis. Electronically Signed   By: Richarda Overlie M.D.   On: 09/28/2016 07:49   US Abdomen Limited Ruq  Result Date: 09/26/2016 CLINICAL DATA:  Initial evaluation for elevated transaminitis. EXAM: ULTRASOUND ABDOMEN LIMITED RIGHT UPPER QUADRANT COMPARISON:  None available. FINDINGS: Gallbladder: Evaluation of the gallbladder somewhat limited due to overlying bowel gas and patient positioning. No definite echogenic stones or sludge identified. Gallbladder wall measure within normal limits a 2.3 mm. No free pericholecystic fluid. No sonographic Murphy sign elicited on exam. Common bile duct: Diameter: 2.9 mm Liver: No focal lesion identified. Within normal limits in parenchymal echogenicity. IMPRESSION: Grossly normal right upper quadrant ultrasound without cholelithiasis, evidence for acute cholecystitis, or biliary dilatation. Please note that evaluation of the gallbladder mildly limited due to shadowing from overlying bowel gas and patient positioning. Electronically Signed   By: Rise Mu M.D.   On: 09/26/2016 21:43    Microbiology: Recent Results (from the past 240 hour(s))  Anaerobic culture     Status: None   Collection Time: 09/26/16  7:15 PM  Result Value Ref Range Status   Specimen Description SYNOVIAL FLUID LEFT HIP  Final   Special Requests NONE  Final   Culture NO ANAEROBES ISOLATED  Final   Report Status 10/02/2016 FINAL  Final  Body fluid culture     Status: None   Collection Time: 09/26/16  7:15 PM  Result Value Ref Range Status   Specimen Description SYNOVIAL FLUID LEFT HIP  Final   Special Requests NONE  Final   Gram Stain   Final    MODERATE WBC PRESENT, PREDOMINANTLY PMN NO ORGANISMS SEEN    Culture NO GROWTH 3 DAYS  Final   Report Status 09/30/2016 FINAL  Final     Labs: Basic Metabolic Panel:  Recent Labs Lab 09/30/16 0522 10/01/16 0324 10/04/16 0248  NA 140 141 139  K 2.8* 3.5 3.7   CL 106 107 106  CO2 24 30 25   GLUCOSE 148* 105* 87  BUN 9 9 13   CREATININE 0.91 0.84 0.79  CALCIUM 8.6* 8.9 8.8*  MG 1.7  --   --    Liver Function Tests:  Recent Labs Lab 09/30/16 0522 10/01/16 0324 10/02/16 0303 10/04/16 0248  AST 354* 239* 107* 66*  ALT 148* 140* 99* 76*  ALKPHOS 43 54 48 47  BILITOT 0.9 0.9 0.8 1.0  PROT 5.2* 5.8* 5.4* 5.4*  ALBUMIN 2.7* 3.1* 2.9* 3.1*   No results for input(s): LIPASE, AMYLASE in the last 168 hours. No results for input(s): AMMONIA in the last 168 hours. CBC: No results for input(s): WBC, NEUTROABS, HGB, HCT, MCV, PLT in the last 168 hours. Cardiac Enzymes:  Recent Labs Lab 09/30/16 0522 10/01/16 0700 10/02/16 0303  CKTOTAL 11,038* 5,055* 1,599*   BNP: BNP (last 3 results) No results for input(s): BNP in the last 8760 hours.  ProBNP (last 3 results) No results for input(s): PROBNP in the last 8760 hours.  CBG: No results for input(s): GLUCAP in the last 168 hours.   Signed:  Penny Pia MD.  [facility] 10/06/2016, 4:04 PM

## 2016-10-06 NOTE — Social Work (Signed)
CSW discussed case with spiritual counselor and they provided POA information, however, pt unable to complete at this time given mental status.  Family-mom/aunt would be decision maker for patient.  Pt does not want to go to SNF. CSW had lengthy conversations with patient/family at bedside about options would be out of county if CSW could get approval to do so. CSW was later advised my Dr. Jacky KindleAronson that he would not be able to be placed in SNF.  Family had already decided that they would take pt home and he would live with mother and stepfather temporarily and return home to live with aunt and grandmother.  CSW discussed with mother who was upset as she was thinking she would have more time for patient to remain in hospital while she organized the home situation. CSW listened actively and tried to provide support during this difficult time. Mom indicated that patient can only afford generic prescriptions as she would have to fill prescriptions.  CSW discussed with mom following up in community at social service office to see what services he would be eligible for as well as seeing if patient would qualify for medicaid.    CSW will sign off for now as social work intervention is no longer needed. Please consult us again if new need arises.  Keene BreathPatricia Courney Garrod, LCSW Clinical Social Worker [phone]

## 2016-10-07 NOTE — Care Management (Signed)
Case manager has tried to reach patient and his mother to arrange for them to go to [facility] , [address], [place], [place] to get established for medical home.

## 2016-10-08 NOTE — Care Management (Signed)
Patient has an appointment on Wednesday, August 22. 2018 at 2:30pm at [facility]. CM spoke with patient's mom, Justice Britain [phone].

## 2016-10-13 ENCOUNTER — Ambulatory Visit (INDEPENDENT_AMBULATORY_CARE_PROVIDER_SITE_OTHER): Payer: Medicaid Other | Admitting: Family Medicine

## 2016-10-13 ENCOUNTER — Encounter: Payer: Self-pay | Admitting: Family Medicine

## 2016-10-13 VITALS — BP 125/83 | HR 89 | Temp 97.4°F | Ht 73.0 in | Wt 174.0 lb

## 2016-10-13 DIAGNOSIS — R9089 Other abnormal findings on diagnostic imaging of central nervous system: Secondary | ICD-10-CM | POA: Diagnosis not present

## 2016-10-13 DIAGNOSIS — R930 Abnormal findings on diagnostic imaging of skull and head, not elsewhere classified: Secondary | ICD-10-CM | POA: Diagnosis not present

## 2016-10-13 DIAGNOSIS — Z029 Encounter for administrative examinations, unspecified: Secondary | ICD-10-CM

## 2016-10-13 DIAGNOSIS — G4709 Other insomnia: Secondary | ICD-10-CM

## 2016-10-13 DIAGNOSIS — F209 Schizophrenia, unspecified: Secondary | ICD-10-CM | POA: Diagnosis not present

## 2016-10-13 DIAGNOSIS — I6930 Unspecified sequelae of cerebral infarction: Secondary | ICD-10-CM | POA: Diagnosis not present

## 2016-10-13 DIAGNOSIS — F1910 Other psychoactive substance abuse, uncomplicated: Secondary | ICD-10-CM

## 2016-10-13 MED ORDER — QUETIAPINE FUMARATE ER 300 MG PO TB24: 300.0000 mg | ORAL_TABLET | Freq: Every day | ORAL | 1 refills | Status: DC

## 2016-10-13 NOTE — Progress Notes (Signed)
BP 125/83 (BP Location: Left Arm)   Pulse 89   Temp (!) 97.4 F (36.3 C) (Oral)   Ht 6\' 1"  (1.854 m)   Wt 174 lb (78.9 kg)   BMI 22.96 kg/m    Subjective:    Patient ID: Phillip Mcdowell, male    DOB: 11-Jun-1980, 36 y.o.   MRN: [id number]  HPI: Phillip Mcdowell is a 36 y.o. male presenting on 10/13/2016 for Establish Care (NEW ) and Hypertension   HPI Hospital follow-up for stroke and drug overdose Patient was admitted on 09/26/2016 for a witnessed seizure and stroke secondary to drug overdose of heroin that was suspected to be laced with fentanyl. Patient admits to having a history of heroin drug use and what has brought him to the situation. He was found to have right-sided weakness and difficulty ambulating. He had a seizure initially and was taken to the hospital and had an MRI which showed ischemic regions. He does admit to extensive history of schizophrenia and is working on getting into a psychiatrist and extensive history of drug abuse. The weakness on his right side has been improving. He needs referral to both neurology and psychiatry.  Relevant past medical, surgical, family and social history reviewed and updated as indicated. Interim medical history since our last visit reviewed. Allergies and medications reviewed and updated.  Review of Systems  Constitutional: Negative for chills and fever.  Eyes: Negative for discharge.  Respiratory: Negative for shortness of breath and wheezing.   Cardiovascular: Negative for chest pain and leg swelling.  Musculoskeletal: Negative for back pain and gait problem.  Skin: Negative for rash.  Neurological: Positive for weakness and numbness. Negative for dizziness, facial asymmetry and light-headedness.  Psychiatric/Behavioral: Positive for behavioral problems, decreased concentration, dysphoric mood and sleep disturbance. Negative for self-injury and suicidal ideas. The patient is nervous/anxious.   All other systems reviewed and are  negative.   Per HPI unless specifically indicated above  Social History   Social History  . Marital status: Legally Separated    Spouse name: N/A  . Number of children: N/A  . Years of education: N/A   Occupational History  . Not on file.   Social History Main Topics  . Smoking status: Current Every Day Smoker    Packs/day: 1.00    Years: 19.00    Types: Cigarettes  . Smokeless tobacco: Never Used  . Alcohol use 12.6 oz/week    21 Cans of beer per week  . Drug use: Yes    Types: Marijuana  . Sexual activity: Yes    Birth control/ protection: Condom     Comment: male, 6 mths   Other Topics Concern  . Not on file   Social History Narrative  . No narrative on file    Past Surgical History:  Procedure Laterality Date  . INGUINAL HERNIA REPAIR Bilateral 1984  . LAPAROSCOPIC CHOLECYSTECTOMY  ~ 2014    Family History  Problem Relation Age of Onset  . Brain cancer Paternal Grandfather   . Cancer Paternal Grandfather        brain  . Diabetes Paternal Grandfather   . Hypertension Mother   . Hyperlipidemia Mother   . Anxiety disorder Mother   . Hypertension Father   . Anxiety disorder Father   . Heart disease Maternal Grandmother   . Hypertension Maternal Grandmother   . Gout Maternal Grandmother   . Anxiety disorder Maternal Grandmother   . Hypertension Maternal Grandfather   . Stroke Maternal Grandfather   .  Heart disease Maternal Grandfather   . Kidney disease Maternal Grandfather   . Cancer Paternal Grandmother        kidney, lung?         Objective:    BP 125/83 (BP Location: Left Arm)   Pulse 89   Temp (!) 97.4 F (36.3 C) (Oral)   Ht 6\' 1"  (1.854 m)   Wt 174 lb (78.9 kg)   BMI 22.96 kg/m   Wt Readings from Last 3 Encounters:  10/13/16 174 lb (78.9 kg)  09/26/16 180 lb (81.6 kg)  05/01/15 180 lb (81.6 kg)    Physical Exam  Constitutional: He is oriented to person, place, and time. He appears well-developed and well-nourished. No  distress.  Eyes: Conjunctivae are normal. No scleral icterus.  Cardiovascular: Normal rate, regular rhythm, normal heart sounds and intact distal pulses.   No murmur heard. Pulmonary/Chest: Effort normal and breath sounds normal. No respiratory distress. He has no wheezes. He has no rales.  Musculoskeletal: Normal range of motion. He exhibits no edema.  Neurological: He is alert and oriented to person, place, and time. No cranial nerve deficit. He exhibits abnormal muscle tone. Coordination normal.  4 out of 5 strength in right arm and leg  Skin: Skin is warm and dry. No rash noted. He is not diaphoretic.  Psychiatric: His mood appears anxious. He exhibits a depressed mood. He expresses no suicidal ideation. He expresses no suicidal plans. He is inattentive.  Nursing note and vitals reviewed.       Assessment & Plan:   Problem List Items Addressed This Visit      Other   Abnormal finding on MRI of brain   Relevant Orders   Ambulatory referral to Neurology   Polysubstance abuse   Relevant Orders   Ambulatory referral to Psychiatry   Abnormal head CT   Relevant Orders   Ambulatory referral to Neurology   Schizophrenia [facility])   Relevant Orders   Ambulatory referral to Psychiatry    Other Visit Diagnoses    Late effects of cerebral ischemic stroke    -  Primary   Relevant Orders   Ambulatory referral to Neurology   Other insomnia       Relevant Medications   QUEtiapine (SEROQUEL XR) 300 MG 24 hr tablet   Other Relevant Orders   Ambulatory referral to Psychiatry       Follow up plan: Return in about 4 weeks (around 11/10/2016), or if symptoms worsen or fail to improve, for follow up insomnnia.  Arville Care, MD [facility] 10/13/2016, 3:23 PM

## 2016-10-14 ENCOUNTER — Encounter: Payer: Self-pay | Admitting: Neurology

## 2016-10-15 ENCOUNTER — Encounter: Payer: Self-pay | Admitting: Pediatrics

## 2016-10-15 ENCOUNTER — Ambulatory Visit (INDEPENDENT_AMBULATORY_CARE_PROVIDER_SITE_OTHER): Payer: Medicaid Other | Admitting: Pediatrics

## 2016-10-15 ENCOUNTER — Telehealth: Payer: Self-pay | Admitting: *Deleted

## 2016-10-15 VITALS — BP 134/88 | HR 85 | Temp 98.8°F

## 2016-10-15 DIAGNOSIS — F1910 Other psychoactive substance abuse, uncomplicated: Secondary | ICD-10-CM | POA: Diagnosis not present

## 2016-10-15 DIAGNOSIS — R030 Elevated blood-pressure reading, without diagnosis of hypertension: Secondary | ICD-10-CM

## 2016-10-15 DIAGNOSIS — R9089 Other abnormal findings on diagnostic imaging of central nervous system: Secondary | ICD-10-CM | POA: Diagnosis not present

## 2016-10-15 DIAGNOSIS — M545 Low back pain: Secondary | ICD-10-CM

## 2016-10-15 DIAGNOSIS — R451 Restlessness and agitation: Secondary | ICD-10-CM

## 2016-10-15 MED ORDER — QUETIAPINE FUMARATE 50 MG PO TABS: 50.0000 mg | ORAL_TABLET | Freq: Every day | ORAL | 1 refills | Status: DC

## 2016-10-15 MED ORDER — DIAZEPAM 2 MG PO TABS: 2.0000 mg | ORAL_TABLET | Freq: Four times a day (QID) | ORAL | 0 refills | Status: AC | PRN

## 2016-10-15 NOTE — Progress Notes (Signed)
Subjective:   Patient ID: Phillip Mcdowell, male    DOB: [birth year], 36 y.o.   MRN: [id number] CC: Hypertension  HPI: Phillip Mcdowell is a 36 y.o. male presenting for Hypertension  Discharged 8/15 after ten day stay in hospital Was admitted with back pain, leg numbness after being found on the ground Treated for rhabdomyolysis, had MRI of brain that suggested b/l hippocampal infract associated with fentanyl H/o polysubstance abuse up to admission Rehab was recommended in hospital, not able to be placed at SNF, [facility] recommended. Pt increased fall risk, with ongoing leg weakness, gait changes during PT eval in hospital.  Seen in clinic 2 days ago for hospital f/u and to establish care BP at discharge and at recent appt was wnl Here today bc mom and GM both very worried about his agitation at home They say he is sleeping poorly, talking and yelling in his sleep Scaring the kids that are also living at home with yelling per mom Has not done anything violent, not throwing anything  Pt says he thinks he is sleeping ok  PT has been coming to house BP today when PT was there was 170/100 Per mom and GM pt was agitated at the time, pt says he gets frustrated with PT, just wants to get back to normal life Now with significant limitations in his mobility however  Was started on seroquel while in hospital, 50mg  TID Mom says she has been giving it to him BID past two days Was increased to 300mg  at recent visit, he has not yet taken that dose Pt says he can't tell the difference with the seroquel In the hospital he was getting xanax with the seroquel  Relevant past medical, surgical, family and social history reviewed. Allergies and medications reviewed and updated. History  Smoking Status  . Current Every Day Smoker  . Packs/day: 1.00  . Years: 19.00  . Types: Cigarettes  Smokeless Tobacco  . Never Used   ROS: Per HPI   Objective:    BP 134/88   Pulse 85   Temp 98.8 F (37.1 C) (Oral)     Wt Readings from Last 3 Encounters:  10/13/16 174 lb (78.9 kg)  09/26/16 180 lb (81.6 kg)  05/01/15 180 lb (81.6 kg)    Gen: NAD, alert, cooperative with exam, NCAT, in wheelchair EYES: EOMI, no conjunctival injection, or no icterus CV: NRRR, normal S1/S2, no murmur, distal pulses 2+ b/l Resp: CTABL, no wheezes, normal WOB Abd: +BS, soft, NTND. no guarding or organomegaly Ext: No edema, warm Neuro: Alert and oriented Psych: answers questions appropriately, good eye contact  Assessment & Plan:  Addis was seen today for hypertension.  Diagnoses and all orders for this visit:  Elevated blood pressure reading Improved here Recheck BP after 20 min if elevated at home  Agitation Gave one Rx for #10 tabs valium to take as needed for agitation as one time Rx Cont seroquel 50mg  TID Discussed resources in area including [facility] as below -     diazepam (VALIUM) 2 MG tablet; Take 1 tablet (2 mg total) by mouth every 6 (six) hours as needed for anxiety. -     QUEtiapine (SEROQUEL) 50 MG tablet -     Ambulatory referral to Psychiatry  Acute low back pain, unspecified back pain laterality, with sciatica presence unspecified Abnormal finding on MRI of brain Cont PT  Polysubstance abuse H/o polysubstance abuse, using up until hospitalization Needs treatment for SA, bipolar disorder Referral placed last visit for psychiatry  Discussed walk-in appts at [facility] if he needs to be seen sooner  Follow up plan: As scheduled, sooner if needed Rex Kras, MD [facility]

## 2016-10-15 NOTE — Progress Notes (Signed)
   Subjective:    Patient ID: Phillip Mcdowell, male    DOB: 1980-04-21, 36 y.o.   MRN: [id number]  HPI    Review of Systems     Objective:   Physical Exam        Assessment & Plan:

## 2016-10-15 NOTE — Telephone Encounter (Signed)
TC from physical therapist w/ [facility] Pt seen today - family reported pt has been very agitated, not sleeping at night, yelling out, having hallucinations. Therapist reported pt's BP 170/100 during visit, pt was agitated and sleepy Family is wanting to know if there is anything that can help

## 2016-10-15 NOTE — Telephone Encounter (Signed)
Per pt's mother, pt's BP is very elevated Mother also reports pt is hallucinating and very agitated appt scheduled

## 2016-10-18 ENCOUNTER — Telehealth: Payer: Self-pay | Admitting: Family Medicine

## 2016-10-18 NOTE — Telephone Encounter (Signed)
He was given some Valium by my colleague just 3 days ago, that should help him, we need to get him into psychiatry ASAP, can recheck on that referral.

## 2016-10-18 NOTE — Telephone Encounter (Signed)
Please review and advise.

## 2016-10-18 NOTE — Telephone Encounter (Signed)
carlon - can you check on referral asap and let me know what you find out.

## 2016-10-18 NOTE — Telephone Encounter (Signed)
Patient's mother aware of Dr. Jacqualin Combes recommendations

## 2016-10-18 NOTE — Telephone Encounter (Signed)
He can take ibuprofen 600mg  q6h for pain as needed. Has he got an appt with psychiatry yet? Can help with substance abuse treatment [facility]: Phone: [phone] [facility]: [phone]

## 2016-10-18 NOTE — Telephone Encounter (Signed)
I agree

## 2016-10-19 ENCOUNTER — Telehealth (HOSPITAL_COMMUNITY): Payer: Self-pay | Admitting: *Deleted

## 2016-10-19 ENCOUNTER — Telehealth: Payer: Self-pay

## 2016-10-19 ENCOUNTER — Telehealth: Payer: Self-pay | Admitting: Pediatrics

## 2016-10-19 NOTE — Telephone Encounter (Signed)
Pt was seen by provider

## 2016-10-19 NOTE — Telephone Encounter (Signed)
spoke with [name] at [facility] regarding a referral for Phillip Mcdowell.   Per Dr. Tenny Craw, the patient needs to go back to ER and needs a referral to rehab.

## 2016-10-19 NOTE — Telephone Encounter (Signed)
Called pt, no answer. Will try again shortly.

## 2016-10-19 NOTE — Telephone Encounter (Signed)
Dr Tenny Craw said patient needs to go to ER or to Rehab

## 2016-10-19 NOTE — Telephone Encounter (Signed)
Talked with mom. She says he continues to yell at times, pick fights with family members, right now is at her mothers house because he was upset with mom Was evaluated at [facility] this morning with mom, was there for hours, pt got agitated and they had to leave before eval completed Discussed if he is unsafe to family or himself he needs to go to ED. If she feels threatened to call 911. Gave her [employer] [phone] to find rehab that would take his insurance.  Mom doesn't think he would agree to inpt or outpt rehab but isnt sure They are to call [facility] for f/u appt

## 2016-10-20 ENCOUNTER — Telehealth: Payer: Self-pay | Admitting: Family Medicine

## 2016-10-20 NOTE — Telephone Encounter (Signed)
appt scheduled Pt notified 

## 2016-10-20 NOTE — Telephone Encounter (Signed)
x

## 2016-10-21 ENCOUNTER — Ambulatory Visit (INDEPENDENT_AMBULATORY_CARE_PROVIDER_SITE_OTHER): Payer: Medicaid Other | Admitting: Family Medicine

## 2016-10-21 ENCOUNTER — Encounter: Payer: Self-pay | Admitting: Family Medicine

## 2016-10-21 VITALS — BP 135/88 | HR 105 | Temp 97.4°F | Ht 73.0 in | Wt 174.0 lb

## 2016-10-21 DIAGNOSIS — M79604 Pain in right leg: Secondary | ICD-10-CM | POA: Diagnosis not present

## 2016-10-21 DIAGNOSIS — F1123 Opioid dependence with withdrawal: Secondary | ICD-10-CM

## 2016-10-21 DIAGNOSIS — M545 Low back pain: Secondary | ICD-10-CM | POA: Diagnosis not present

## 2016-10-21 DIAGNOSIS — F1193 Opioid use, unspecified with withdrawal: Secondary | ICD-10-CM

## 2016-10-21 DIAGNOSIS — F19982 Other psychoactive substance use, unspecified with psychoactive substance-induced sleep disorder: Secondary | ICD-10-CM | POA: Diagnosis not present

## 2016-10-21 MED ORDER — DICLOFENAC SODIUM 75 MG PO TBEC: 75.0000 mg | DELAYED_RELEASE_TABLET | Freq: Two times a day (BID) | ORAL | 0 refills | Status: DC

## 2016-10-21 MED ORDER — CLONIDINE HCL 0.1 MG PO TABS: 0.1000 mg | ORAL_TABLET | Freq: Two times a day (BID) | ORAL | 1 refills | Status: DC

## 2016-10-21 NOTE — Progress Notes (Signed)
BP 135/88   Pulse (!) 105   Temp (!) 97.4 F (36.3 C) (Oral)   Ht 6\' 1"  (1.854 m)   Wt 174 lb (78.9 kg)   BMI 22.96 kg/m    Subjective:    Patient ID: Phillip Mcdowell, male    DOB: 30-May-1980, 36 y.o.   MRN: [id number]  HPI: Phillip Mcdowell is a 36 y.o. male presenting on 10/21/2016 for Pain in leg & foot (becoming very agitated, would like to have something that is non-narcotic for pain) and Discuss Seroquel (has been unable to complete interview with [facility] because of agitation, going back today with friend to try to talk with them)   HPI Leg pain, continued Patient has had continued right leg pain from after his fall and his stroke. He has been using Tylenol for it which does not help. Patient does have a history of narcotic abuse and is not really candidate for narcotic prescription medication. We will try nonnarcotic options to help with his pain. Patient seems unhappy about that but with his history it is too challenging of the situation. He rates the pain as 10 out of 10  Difficulty sleeping and agitation Patient has been having difficulty sleeping and agitation that is been continued. He has been using the Seroquel and he was supposed to go see [facility] but he has not seen them yet. They said that they will go see them today or tomorrow if possible. He denies any suicidal ideations or thoughts of hurting himself. He would like to know if there is something else that might help him.  Relevant past medical, surgical, family and social history reviewed and updated as indicated. Interim medical history since our last visit reviewed. Allergies and medications reviewed and updated.  Review of Systems  Constitutional: Negative for chills and fever.  Eyes: Negative for discharge.  Respiratory: Negative for shortness of breath and wheezing.   Cardiovascular: Negative for chest pain and leg swelling.  Musculoskeletal: Positive for arthralgias. Negative for back pain and gait  problem.  Skin: Negative for rash.  Psychiatric/Behavioral: Positive for agitation, behavioral problems, dysphoric mood and sleep disturbance. Negative for self-injury and suicidal ideas. The patient is nervous/anxious.   All other systems reviewed and are negative.   Per HPI unless specifically indicated above        Objective:    BP 135/88   Pulse (!) 105   Temp (!) 97.4 F (36.3 C) (Oral)   Ht 6\' 1"  (1.854 m)   Wt 174 lb (78.9 kg)   BMI 22.96 kg/m   Wt Readings from Last 3 Encounters:  10/21/16 174 lb (78.9 kg)  10/13/16 174 lb (78.9 kg)  05/01/15 180 lb (81.6 kg)    Physical Exam  Constitutional: He is oriented to person, place, and time. He appears well-developed and well-nourished. No distress.  Eyes: Conjunctivae are normal. No scleral icterus.  Cardiovascular: Normal rate, regular rhythm, normal heart sounds and intact distal pulses.   No murmur heard. Pulmonary/Chest: Effort normal and breath sounds normal. No respiratory distress. He has no wheezes. He has no rales.  Musculoskeletal: Normal range of motion. He exhibits tenderness (Right lower extremity tenderness from mid calf down to the foot, no erythema or warmth noted). He exhibits no edema.  Neurological: He is alert and oriented to person, place, and time. Coordination normal.  Skin: Skin is warm and dry. No rash noted. He is not diaphoretic.  Psychiatric: His mood appears anxious. He is agitated. He  expresses inappropriate judgment. He exhibits a depressed mood. He expresses no suicidal ideation. He expresses no suicidal plans.  Nursing note and vitals reviewed.     Assessment & Plan:   Problem List Items Addressed This Visit      Other   Back pain   Relevant Medications   diclofenac (VOLTAREN) 75 MG EC tablet    Other Visit Diagnoses    Right leg pain    -  Primary   Relevant Medications   diclofenac (VOLTAREN) 75 MG EC tablet   Drug-induced insomnia (HCC)       Relevant Medications   cloNIDine  (CATAPRES) 0.1 MG tablet   Opiate withdrawal (HCC)       Relevant Medications   cloNIDine (CATAPRES) 0.1 MG tablet       Follow up plan: Return if symptoms worsen or fail to improve.  Counseling provided for all of the vaccine components No orders of the defined types were placed in this encounter.   Arville CareJoshua Dettinger, MD [facility] 10/21/2016, 11:38 AM

## 2016-10-29 ENCOUNTER — Telehealth: Payer: Self-pay

## 2016-10-29 DIAGNOSIS — I6381 Other cerebral infarction due to occlusion or stenosis of small artery: Secondary | ICD-10-CM

## 2016-10-29 DIAGNOSIS — M21371 Foot drop, right foot: Secondary | ICD-10-CM

## 2016-10-29 DIAGNOSIS — R29898 Other symptoms and signs involving the musculoskeletal system: Secondary | ICD-10-CM

## 2016-10-29 NOTE — Addendum Note (Signed)
Addended by: Magdalene RiverBULLINS, Brentyn Seehafer H on: 10/29/2016 02:36 PM   Modules accepted: Orders

## 2016-10-29 NOTE — Telephone Encounter (Signed)
[facility] just left and said they need and order for ASO right foot drop to send to [facility]

## 2016-10-29 NOTE — Telephone Encounter (Signed)
Mom aware order is ready

## 2016-10-29 NOTE — Telephone Encounter (Signed)
Go ahead and write order for him, diagnosis right foot drop aso ankle brace

## 2016-11-01 ENCOUNTER — Other Ambulatory Visit: Payer: Self-pay | Admitting: Family Medicine

## 2016-11-01 DIAGNOSIS — M545 Low back pain: Secondary | ICD-10-CM

## 2016-11-01 DIAGNOSIS — M79604 Pain in right leg: Secondary | ICD-10-CM

## 2016-11-01 MED ORDER — DICLOFENAC SODIUM 75 MG PO TBEC: 75.0000 mg | DELAYED_RELEASE_TABLET | Freq: Two times a day (BID) | ORAL | 2 refills | Status: DC

## 2016-11-01 NOTE — Telephone Encounter (Signed)
What is the name of the medication? Diclofenac 75 mg  Have you contacted your pharmacy to request a refill? NO  Which pharmacy would you like this sent to? [facility] [place]   Patient notified that their request is being sent to the clinical staff for review and that they should receive a call once it is complete. If they do not receive a call within 24 hours they can check with their pharmacy or our office.

## 2016-11-02 ENCOUNTER — Telehealth: Payer: Self-pay

## 2016-11-02 MED ORDER — MELOXICAM 15 MG PO TABS: 15.0000 mg | ORAL_TABLET | Freq: Every day | ORAL | 3 refills | Status: AC

## 2016-11-02 MED ORDER — MELOXICAM 15 MG PO TABS: 15.0000 mg | ORAL_TABLET | Freq: Every day | ORAL | 3 refills | Status: DC

## 2016-11-02 NOTE — Telephone Encounter (Signed)
Medicaid non preferred Diclofenac tablet  Preferred are indomethacine cap., ketorolac tab., meloxicam tab., naproxen EC tab., naproxen tab., sulindac tab.

## 2016-11-02 NOTE — Telephone Encounter (Signed)
Diclofenac changed to mobic. Prescription sent to pharmacy

## 2016-11-02 NOTE — Telephone Encounter (Signed)
Closing encounter Refill sent into pharmacy under another encounter

## 2016-11-02 NOTE — Telephone Encounter (Signed)
Aware that medication was sent to pharmacy

## 2016-11-02 NOTE — Addendum Note (Signed)
Addended by: Caryl BisBOWMAN, Denzil Mceachron M on: 11/02/2016 04:20 PM   Modules accepted: Orders

## 2016-11-10 ENCOUNTER — Encounter: Payer: Self-pay | Admitting: Neurology

## 2016-11-10 ENCOUNTER — Ambulatory Visit (INDEPENDENT_AMBULATORY_CARE_PROVIDER_SITE_OTHER): Payer: Medicaid Other | Admitting: Neurology

## 2016-11-10 VITALS — BP 108/80 | HR 81 | Ht 74.0 in | Wt 197.3 lb

## 2016-11-10 DIAGNOSIS — I69311 Memory deficit following cerebral infarction: Secondary | ICD-10-CM

## 2016-11-10 DIAGNOSIS — F17200 Nicotine dependence, unspecified, uncomplicated: Secondary | ICD-10-CM | POA: Diagnosis not present

## 2016-11-10 DIAGNOSIS — F1910 Other psychoactive substance abuse, uncomplicated: Secondary | ICD-10-CM | POA: Diagnosis not present

## 2016-11-10 NOTE — Progress Notes (Signed)
NEUROLOGY CONSULTATION NOTE  Phillip Mcdowell MRN: [id number] DOB: 1981-02-16  Referring provider: Dr. Louanne Skye Primary care provider: Dr. Louanne Skye  Reason for consult:  Stroke  HISTORY OF PRESENT ILLNESS: Phillip Mcdowell is a 36 year old right-handed male with Bipolar affective disorder, schizophrenia and polysubstance abuse who presents for stroke. He is accompanied by his friend who supplements history.  History is also supplemented by hospital records.  He has a history of polysubstance abuse including alcohol, marijuana and heroin use.  He was admitted to [facility] from 09/26/16 to 10/06/16 for acute encephalopathy with low back pain radiating down the legs after a fall.  He was found on the floor of his room by his aunt after he had overdosed on heroin laced with fentanyl.  He complained of bilateral lower extremity pain with inability to move his legs.  Urine tox screen performed in the hospital was positive for amphetamines, opiates and benzodiazepines.  He was afebrile.  On exam he demonstrated generalized weakness, more severe in both lower extremities.  However, he exhibited hyperreflexia in the left lower extremity.  CT of head showed abnormal appearance of the temporal horns of the lateral ventricles concerning for meningitis or drug use.  Follow up MRI of the brain with and without contrast demonstrated symmetric bilateral hippocampi ischemic infarcts commonly seen with drug overdose such as fentanyl, as well as lacunar infarcts as well as lacunar infarcts in the bilateral basal ganglia and left cerebellum.  MRI of cervical spine was performed, which revealed foraminal disc protrusion at C5-C6 but no canal stenosis causing myelopathy.  Labs revealed leukocytosis, elevated LFTs and elevated troponins secondary to rhabdomyolysis.  HIV status was negative but he did demonstrate positive HCV antibodies.  He also demonstrated elevated lipase.  Imaging demonstrated mild retroperitoneal  edema and CT of lumbar spine happened to show pancreatic thickening suggesting pancreatitis, but he was asymptomatic.  Regarding low back pain and bilateral lower extremity pain and weakness, CT of thoracic and lumbar spine was unremarkable.  He did exhibit left hip joint effusion with muscle edema in the pelvis, concerning for infectious myositis.  Orthopedics recommended antibiotics and aspiration of the left hip joint.  He also demonstrated jerking movements of the lower extremities.  EEG revealed nonspecific diffuse slowing.  Repeat CT of head on 10/04/16 showed interval improvement in the hypoattenuation of hippocampi but no acute changes.    Since discharge, he continues to have right lower extremity pain.  NSAIDs and Tylenol are ineffective.  He has been wearing a boot and has been  undergoing PT.  He also has been having short-term memory problems.  Sometimes, he does not remember what he ate for breakfast that morning.  PAST MEDICAL HISTORY: Past Medical History:  Diagnosis Date  . Bipolar 1 disorder (HCC)   . Depression   . Family history of adverse reaction to anesthesia    "maternal grandmother "gets sick"  . History of gout   . Panic attack     PAST SURGICAL HISTORY: Past Surgical History:  Procedure Laterality Date  . INGUINAL HERNIA REPAIR Bilateral 1984  . LAPAROSCOPIC CHOLECYSTECTOMY  ~ 2014    MEDICATIONS: Current Outpatient Prescriptions on File Prior to Visit  Medication Sig Dispense Refill  . cloNIDine (CATAPRES) 0.1 MG tablet Take 1 tablet (0.1 mg total) by mouth 2 (two) times daily. 60 tablet 1  . diazepam (VALIUM) 2 MG tablet Take 1 tablet (2 mg total) by mouth every 6 (six) hours as needed for anxiety.  10 tablet 0  . meloxicam (MOBIC) 15 MG tablet Take 1 tablet (15 mg total) by mouth daily. 30 tablet 3  . QUEtiapine (SEROQUEL) 50 MG tablet Take 1 tablet (50 mg total) by mouth at bedtime. 90 tablet 1   No current facility-administered medications on file prior to  visit.     ALLERGIES: Allergies  Allergen Reactions  . Penicillins Swelling    Has patient had a PCN reaction causing immediate rash, facial/tongue/throat swelling, SOB or lightheadedness with hypotension: Yes Has patient had a PCN reaction causing severe rash involving mucus membranes or skin necrosis: No Has patient had a PCN reaction that required hospitalization: No Has patient had a PCN reaction occurring within the last 10 years: Yes If all of the above answers are "NO", then may proceed with Cephalosporin use.   . Sulfa Antibiotics Other (See Comments)    CHILDHOOD REACTION    FAMILY HISTORY: Family History  Problem Relation Age of Onset  . Brain cancer Paternal Grandfather   . Cancer Paternal Grandfather        brain  . Diabetes Paternal Grandfather   . Hypertension Mother   . Hyperlipidemia Mother   . Anxiety disorder Mother   . Hypertension Father   . Anxiety disorder Father   . Heart disease Maternal Grandmother   . Hypertension Maternal Grandmother   . Gout Maternal Grandmother   . Anxiety disorder Maternal Grandmother   . Hypertension Maternal Grandfather   . Stroke Maternal Grandfather   . Heart disease Maternal Grandfather   . Kidney disease Maternal Grandfather   . Cancer Paternal Grandmother        kidney, lung?    SOCIAL HISTORY: Social History   Social History  . Marital status: Legally Separated    Spouse name: N/A  . Number of children: N/A  . Years of education: N/A   Occupational History  . Not on file.   Social History Main Topics  . Smoking status: Current Every Day Smoker    Packs/day: 1.00    Years: 19.00    Types: Cigarettes  . Smokeless tobacco: Never Used  . Alcohol use 12.6 oz/week    21 Cans of beer per week  . Drug use: Yes    Types: Marijuana  . Sexual activity: Yes    Birth control/ protection: Condom     Comment: male, 6 mths   Other Topics Concern  . Not on file   Social History Narrative  . No narrative on  file    REVIEW OF SYSTEMS: Constitutional: No fevers, chills, or sweats, no generalized fatigue, change in appetite Eyes: No visual changes, double vision, eye pain Ear, nose and throat: No hearing loss, ear pain, nasal congestion, sore throat Cardiovascular: No chest pain, palpitations Respiratory:  No shortness of breath at rest or with exertion, wheezes GastrointestinaI: No nausea, vomiting, diarrhea, abdominal pain, fecal incontinence Genitourinary:  No dysuria, urinary retention or frequency Musculoskeletal:  No neck pain, back pain Integumentary: No rash, pruritus, skin lesions Neurological: as above Psychiatric: No depression, insomnia, anxiety Endocrine: No palpitations, fatigue, diaphoresis, mood swings, change in appetite, change in weight, increased thirst Hematologic/Lymphatic:  No purpura, petechiae. Allergic/Immunologic: no itchy/runny eyes, nasal congestion, recent allergic reactions, rashes  PHYSICAL EXAM: Vitals:   11/10/16 1002  BP: 108/80  Pulse: 81  SpO2: 98%   General: No acute distress.  Patient appears ungroomed.   Head:  Normocephalic/atraumatic Eyes:  fundi examined but not visualized Neck: supple, no paraspinal tenderness, full  range of motion Back: No paraspinal tenderness Heart: regular rate and rhythm Lungs: Clear to auscultation bilaterally. Vascular: No carotid bruits. Neurological Exam: Mental status: alert and oriented to person, place, and year (not month, date or day), delayed recall poor, remote memory intact, fund of knowledge intact, attention and concentration impaired, speech fluent and not dysarthric, language intact. Montreal Cognitive Assessment  11/10/2016  Visuospatial/ Executive (0/5) 4  Naming (0/3) 3  Attention: Read list of digits (0/2) 2  Attention: Read list of letters (0/1) 1  Attention: Serial 7 subtraction starting at 100 (0/3) 3  Language: Repeat phrase (0/2) 2  Language : Fluency (0/1) 0  Abstraction (0/2) 2  Delayed  Recall (0/5) 0  Orientation (0/6) 3  Total 20  Adjusted Score (based on education) 21   Cranial nerves: CN I: not tested CN II: pupils equal, round and reactive to light, visual fields intact CN III, IV, VI:  full range of motion, no nystagmus, no ptosis CN V: facial sensation intact CN VII: upper and lower face symmetric CN VIII: hearing intact CN IX, X: gag intact, uvula midline CN XI: sternocleidomastoid and trapezius muscles intact CN XII: tongue midline Bulk & Tone: normal, no fasciculations. Motor:  5/5 throughout  Sensation: temperature and vibration sensation reduced in right foot, otherwise intact.. Deep Tendon Reflexes:  2+ throughout, toes downgoing. Finger to nose testing:  Without dysmetria.  Heel to shin:  Unable to assess Gait:  In wheelchair.  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 Polysubstance abuse Tobacco use  PLAN: Most appropriate management would be cognitive therapy, usually through speech therapy.  Often Medicaid doesn't cover this but we will see.  Otherwise, no further neurologic testing and follow up needed.  He does not require workup or treatment for secondary stroke prevention as it was induced by drug overdose.  He should follow up with psychiatry and consider pain management for ongoing leg pain.  Recommend smoking cessation   Thank you for allowing me to take part in the care of this patient.  Shon Millet, DO  CC:  Arville Care, MD

## 2016-11-10 NOTE — Patient Instructions (Signed)
1.  We will try to refer you to cognitive/speech therapy to help work on memory.  Otherwise, I have no other recommendations from a neurologic standpoint.  I recommend following up with psychiatry and to ask your PCP for referral to pain management

## 2016-11-12 ENCOUNTER — Other Ambulatory Visit: Payer: Self-pay | Admitting: Family Medicine

## 2016-11-12 DIAGNOSIS — F19982 Other psychoactive substance use, unspecified with psychoactive substance-induced sleep disorder: Secondary | ICD-10-CM

## 2016-11-12 DIAGNOSIS — F1193 Opioid use, unspecified with withdrawal: Secondary | ICD-10-CM

## 2016-11-12 DIAGNOSIS — F1123 Opioid dependence with withdrawal: Secondary | ICD-10-CM

## 2016-11-15 ENCOUNTER — Telehealth: Payer: Self-pay | Admitting: Neurology

## 2016-11-15 ENCOUNTER — Telehealth: Payer: Self-pay | Admitting: Family Medicine

## 2016-11-15 DIAGNOSIS — R451 Restlessness and agitation: Secondary | ICD-10-CM

## 2016-11-15 MED ORDER — QUETIAPINE FUMARATE 50 MG PO TABS
50.0000 mg | ORAL_TABLET | Freq: Every day | ORAL | 0 refills | Status: DC
Start: 2016-11-15 — End: 2016-12-20

## 2016-11-15 NOTE — Telephone Encounter (Signed)
Go ahead and send the Seroquel in for him and tell him that he needs to get in with his psychiatry ASAP

## 2016-11-15 NOTE — Telephone Encounter (Signed)
Darel Hong a speech therapist with [facility] left a message saying pt refused therapy CB# [phone]

## 2016-11-15 NOTE — Telephone Encounter (Signed)
Pt's mother notified of RX 

## 2016-11-16 NOTE — Telephone Encounter (Signed)
LM on VM for Phillip Mcdowell to rtrn my call

## 2016-11-17 NOTE — Telephone Encounter (Signed)
Dr Jaffe *FYI* 

## 2016-11-18 ENCOUNTER — Ambulatory Visit: Payer: Self-pay | Admitting: Family Medicine

## 2016-11-18 ENCOUNTER — Telehealth: Payer: Self-pay | Admitting: Family Medicine

## 2016-11-18 NOTE — Telephone Encounter (Signed)
What is the name of the medication? Nerve medicine  Have you contacted your pharmacy to request a refill? no  Which pharmacy [facility] [place]./// he cancelled todays appt due to diarrhea and vomiting. Caller says he needs something to calm him down   Patient notified that their request is being sent to the clinical staff for review and that they should receive a call once it is complete. If they do not receive a call within 24 hours they can check with their pharmacy or our office.

## 2016-11-18 NOTE — Telephone Encounter (Signed)
Spoke with pt's mother regarding RX for anxiety Informed that seroquel and Clonidine refills were sent into [facility] understanding

## 2016-12-01 ENCOUNTER — Telehealth: Payer: Self-pay | Admitting: Family Medicine

## 2016-12-20 ENCOUNTER — Other Ambulatory Visit: Payer: Self-pay | Admitting: Family Medicine

## 2016-12-20 ENCOUNTER — Ambulatory Visit: Payer: Medicaid Other | Admitting: Family Medicine

## 2016-12-20 DIAGNOSIS — R451 Restlessness and agitation: Secondary | ICD-10-CM

## 2016-12-20 DIAGNOSIS — F1123 Opioid dependence with withdrawal: Secondary | ICD-10-CM

## 2016-12-20 DIAGNOSIS — F1193 Opioid use, unspecified with withdrawal: Secondary | ICD-10-CM

## 2016-12-20 DIAGNOSIS — F19982 Other psychoactive substance use, unspecified with psychoactive substance-induced sleep disorder: Secondary | ICD-10-CM

## 2016-12-20 MED ORDER — QUETIAPINE FUMARATE 50 MG PO TABS
50.0000 mg | ORAL_TABLET | Freq: Every day | ORAL | 0 refills | Status: DC
Start: 2016-12-20 — End: 2016-12-20

## 2016-12-20 MED ORDER — CLONIDINE HCL 0.1 MG PO TABS: 0.1000 mg | ORAL_TABLET | Freq: Two times a day (BID) | ORAL | 0 refills | Status: AC

## 2016-12-20 MED ORDER — QUETIAPINE FUMARATE 50 MG PO TABS: 50.0000 mg | ORAL_TABLET | Freq: Two times a day (BID) | ORAL | 0 refills | Status: DC

## 2016-12-20 MED ORDER — QUETIAPINE FUMARATE 50 MG PO TABS: 50.0000 mg | ORAL_TABLET | Freq: Two times a day (BID) | ORAL | 0 refills | Status: AC

## 2016-12-20 NOTE — Telephone Encounter (Signed)
Will increase Seroquel to BID until PCP can return and address. [facility] [place]

## 2016-12-20 NOTE — Telephone Encounter (Signed)
Patient canceled appt for today, patient needs to see psych again, I can give one month of seroquel and then he needs to see them. I can't manage him

## 2016-12-20 NOTE — Telephone Encounter (Signed)
[facility] [place] [facility] said [facility] did not have his Seroquel refill, [facility] [place]. TC [facility] [place], they can refill medication [facility] [place] taken out of patients profile

## 2016-12-20 NOTE — Telephone Encounter (Signed)
Patient's mother called, she has been giving him his Seroquel incorrectly.  He was taking it tid, you had reduced it to bid.  Dr. Louanne Skyeettinger reduced it to once daily.  She had still been giving it to him twice daily.  Dr. Louanne Skyeettinger sent in a script today for once daily but [facility] [place] [facility] not [facility], said it is too early.  Mother is concerned about him stopping the medication suddenly.  She wants to know what she should do.  Please advise.

## 2016-12-21 NOTE — Telephone Encounter (Signed)
Mother of patient aware of scripts.

## 2017-01-24 ENCOUNTER — Telehealth: Payer: Self-pay | Admitting: Family Medicine

## 2017-01-25 ENCOUNTER — Other Ambulatory Visit: Payer: Self-pay | Admitting: Family

## 2017-02-22 NOTE — Telephone Encounter (Signed)
Pt mother states he is in bad mental state. She needs him sent somewhere they can not handle him any more. Please call mother back if she does not pick up leave a message, she cant answer phone while at work. [facility] Please advise.

## 2017-02-22 NOTE — Telephone Encounter (Signed)
I have actually never seen this patient before. I reviewed PCP notes and believe [facility]. If his mental state has worsen I recommend taking him to the ED and they can do a work up.

## 2017-02-22 NOTE — Telephone Encounter (Signed)
Aware. 

## 2017-02-22 NOTE — Telephone Encounter (Signed)
[facility] is the PCP but mother requests advice from you. Please advise

## 2017-02-22 DEATH — deceased

## 2018-01-03 IMAGING — CT CT HEAD W/O CM
4 series · 17 of 47 positions shown, 19 images · non-contrast
Comparison: 09/27/2016

CLINICAL DATA: Altered mental status

EXAM:
CT HEAD WITHOUT CONTRAST
TECHNIQUE: Contiguous axial images were obtained from the base of the skull
through the vertex without intravenous contrast. Sagittal and
coronal MPR images reconstructed from axial data set.

[Series 3: head bone · axial · 0.41mm/px · z∈[-66,-14]mm · 4 of 76 slices shown]
[im 8/76  bone]
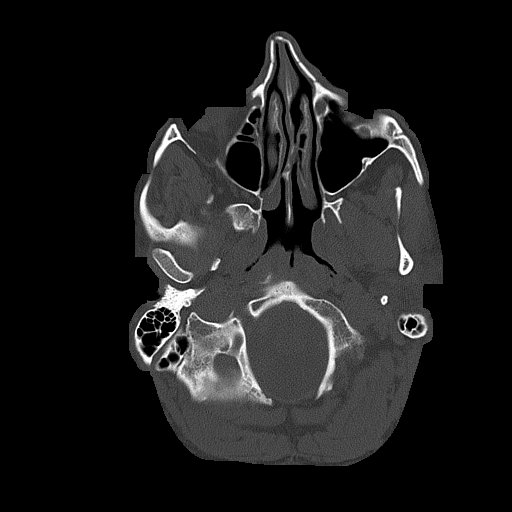
[im 16/76  bone]
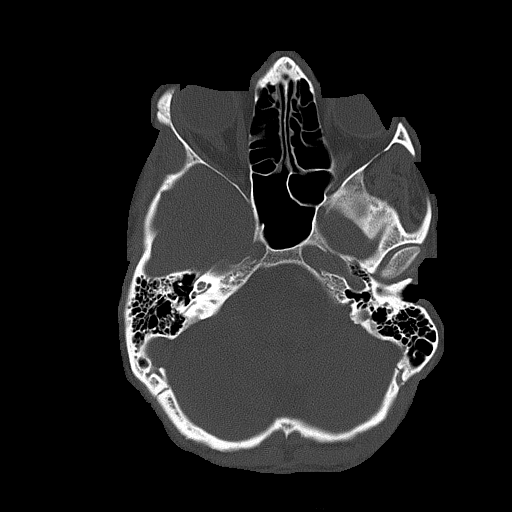
[im 23/76  bone]
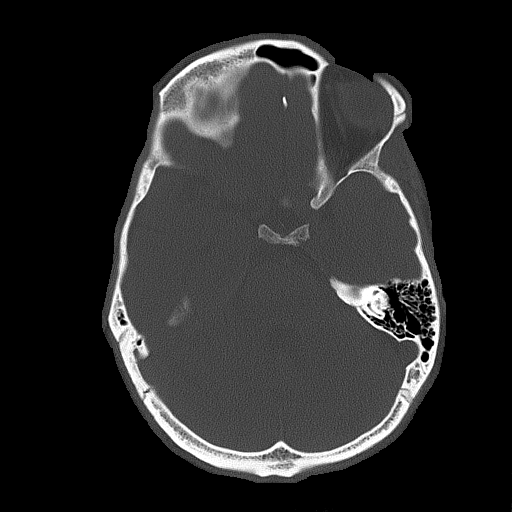
[im 34/76  bone]
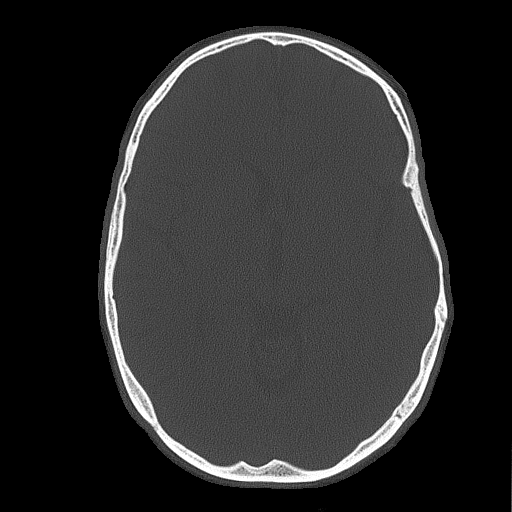

[Series 4: head without · axial · non-contrast · 0.41mm/px · z∈[-66,+50]mm · 7 of 31 slices shown, 9 images]
[im 4/31  brain]
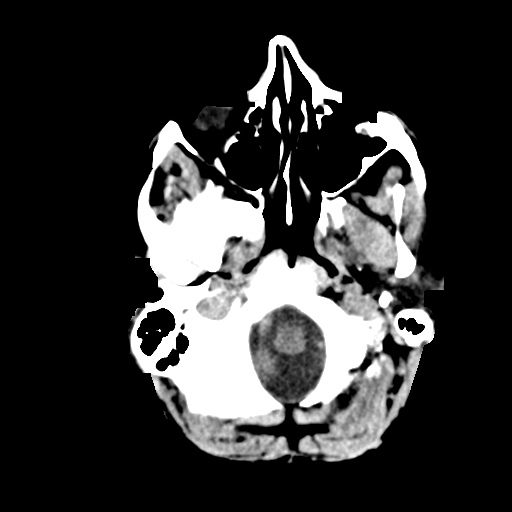
[im 4/31  bone]
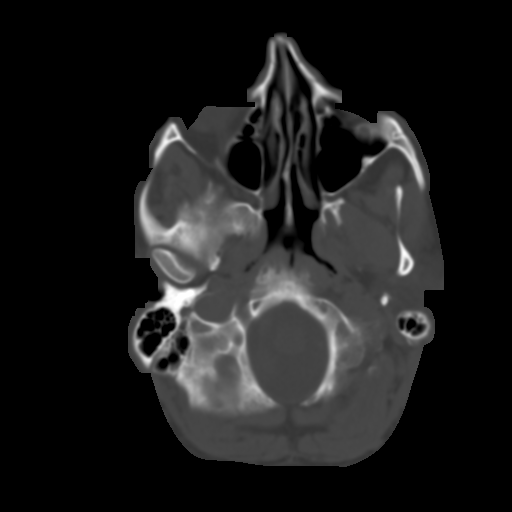
[im 8/31  brain]
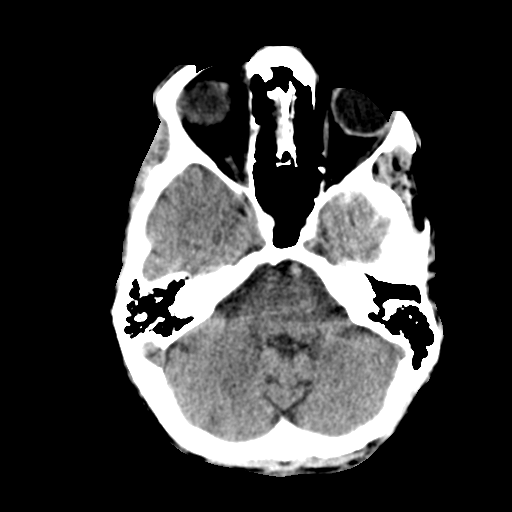
[im 12/31  brain]
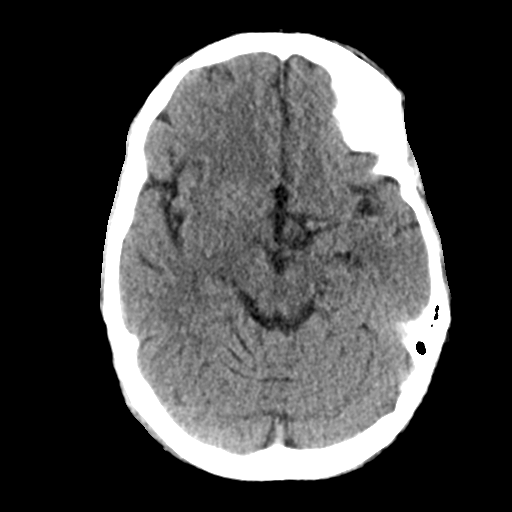
[im 16/31  brain]
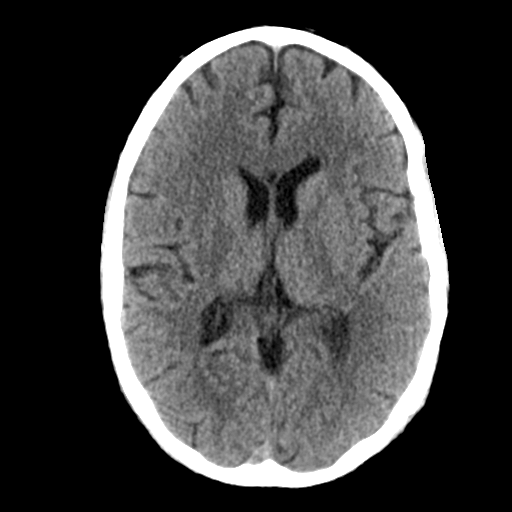
[im 19/31  brain]
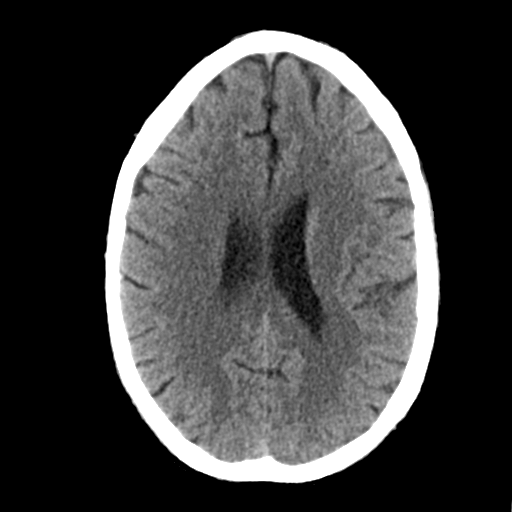
[im 19/31  bone]
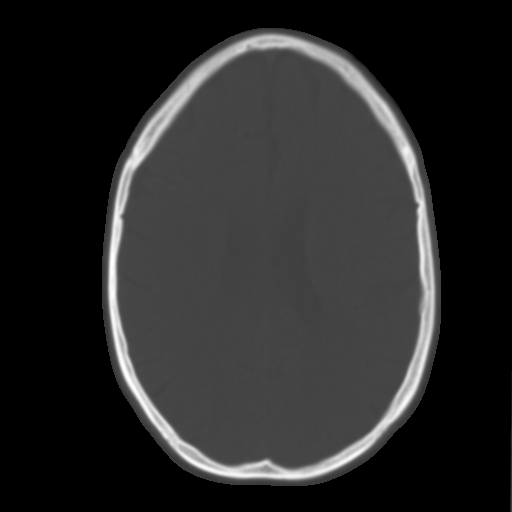
[im 23/31  brain]
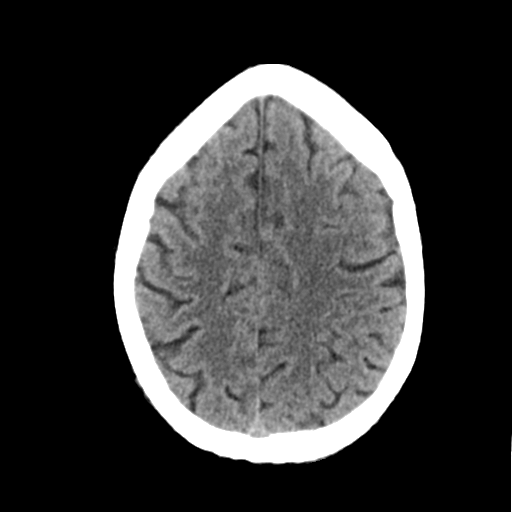
[im 27/31  brain]
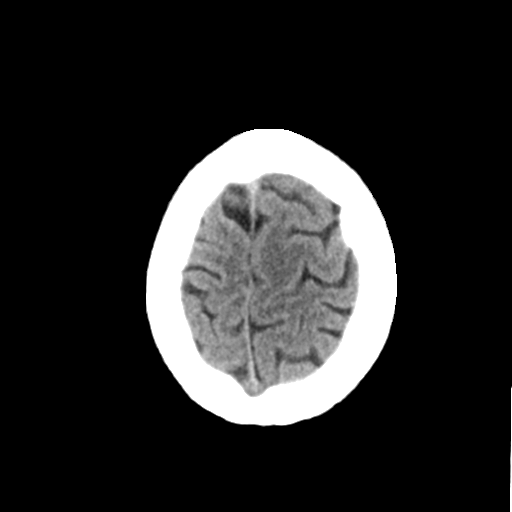

[Series 5: head without cor · coronal · non-contrast · 0.29mm/px · 3 of 67 slices shown]
[im 23/67  brain]
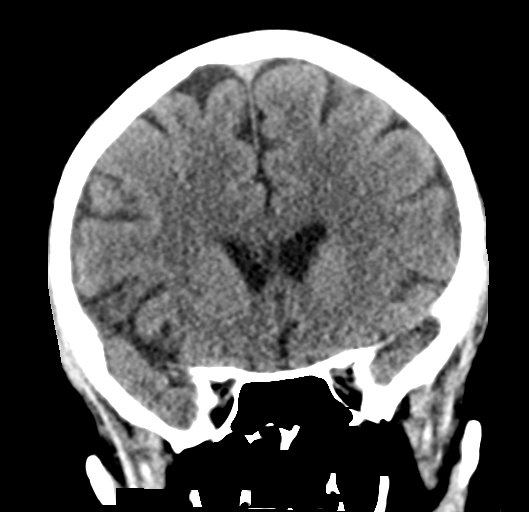
[im 30/67  brain]
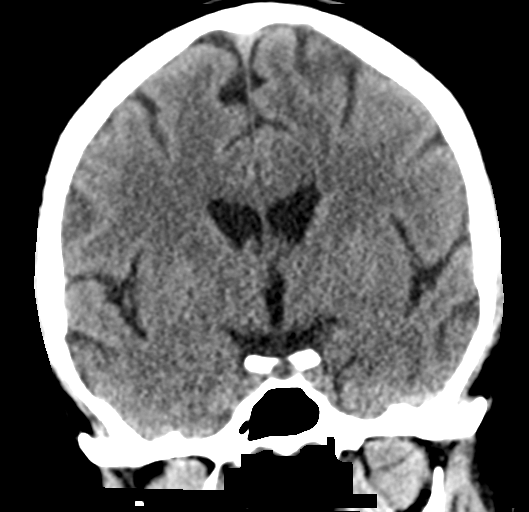
[im 37/67  brain]
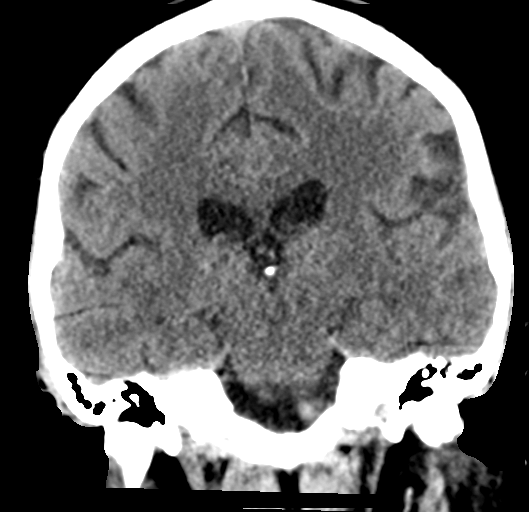

[Series 6: head without sag · sagittal · non-contrast · 0.29mm/px · 3 of 48 slices shown]
[im 16/48  brain]
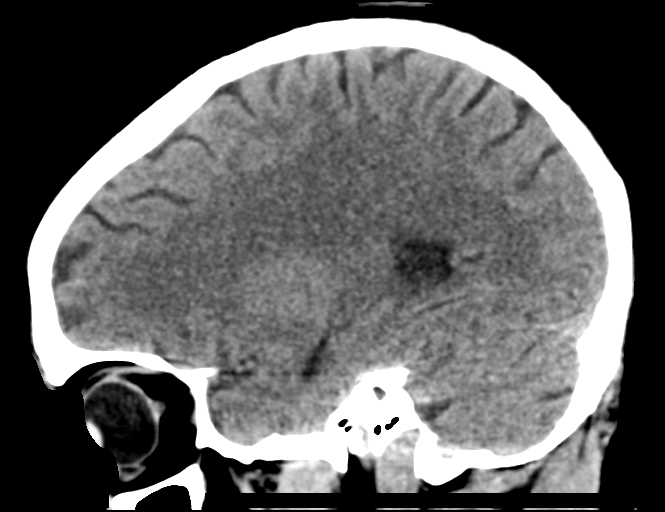
[im 24/48  brain]
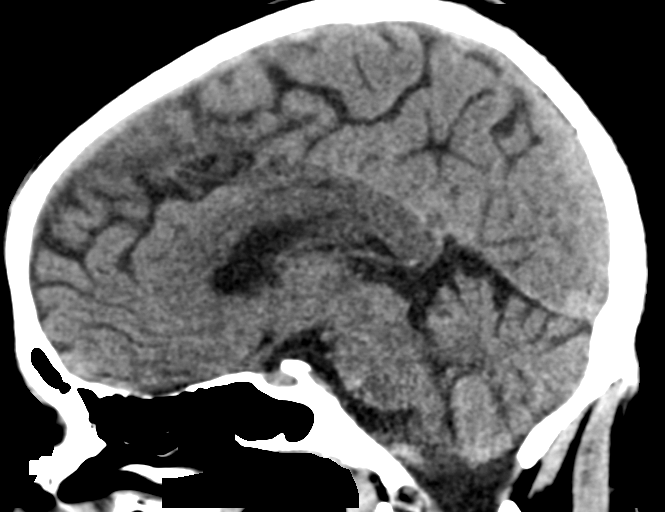
[im 32/48  brain]
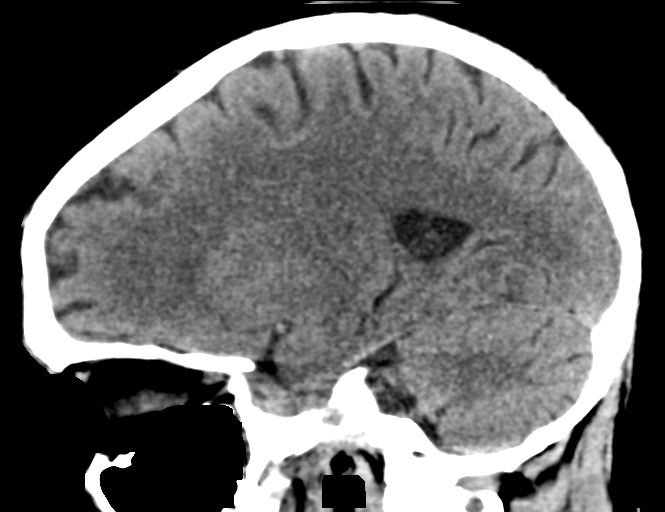

[17 of 47 positions shown; findings below may reference images not displayed]

FINDINGS: Brain: Mild generalized atrophy for age. Normal ventricular
morphology. No midline shift or mass effect. Otherwise normal
appearance of brain parenchyma. Hypoattenuation within the
hippocampi on the previous exam improved. No intracranial
hemorrhage, mass lesion or evidence of acute infarction.

Vascular: Unremarkable

Skull: Normal appearance

Sinuses/Orbits: Clear

Other: N/A
IMPRESSION: No acute intracranial abnormalities.

Interval improvement of the hypoattenuation seen previously within
the hippocampi.
